# Patient Record
Sex: Female | Born: 1970
Health system: Southern US, Community
[De-identification: ages and names within clinical notes are randomized; demographics above are authoritative.]

## PROBLEM LIST (undated history)

## (undated) DIAGNOSIS — F319 Bipolar disorder, unspecified: Secondary | ICD-10-CM

## (undated) DIAGNOSIS — F988 Other specified behavioral and emotional disorders with onset usually occurring in childhood and adolescence: Secondary | ICD-10-CM

## (undated) DIAGNOSIS — G43909 Migraine, unspecified, not intractable, without status migrainosus: Secondary | ICD-10-CM

## (undated) DIAGNOSIS — F419 Anxiety disorder, unspecified: Secondary | ICD-10-CM

## (undated) DIAGNOSIS — F329 Major depressive disorder, single episode, unspecified: Secondary | ICD-10-CM

## (undated) DIAGNOSIS — IMO0002 Reserved for concepts with insufficient information to code with codable children: Secondary | ICD-10-CM

## (undated) DIAGNOSIS — F32A Depression, unspecified: Secondary | ICD-10-CM

## (undated) HISTORY — DX: Migraine, unspecified, not intractable, without status migrainosus: G43.909

## (undated) HISTORY — DX: Depression, unspecified: F32.A

## (undated) HISTORY — DX: Reserved for concepts with insufficient information to code with codable children: IMO0002

## (undated) HISTORY — DX: Major depressive disorder, single episode, unspecified: F32.9

---

## 1998-02-16 ENCOUNTER — Other Ambulatory Visit: Admission: RE | Admit: 1998-02-16 | Discharge: 1998-02-16 | Payer: Self-pay | Admitting: Obstetrics and Gynecology

## 1998-06-23 ENCOUNTER — Inpatient Hospital Stay (HOSPITAL_COMMUNITY): Admission: AD | Admit: 1998-06-23 | Discharge: 1998-06-23 | Payer: Self-pay | Admitting: Obstetrics and Gynecology

## 1998-12-15 ENCOUNTER — Inpatient Hospital Stay (HOSPITAL_COMMUNITY): Admission: AD | Admit: 1998-12-15 | Discharge: 1998-12-18 | Payer: Self-pay | Admitting: Obstetrics and Gynecology

## 1999-01-25 ENCOUNTER — Other Ambulatory Visit: Admission: RE | Admit: 1999-01-25 | Discharge: 1999-01-25 | Payer: Self-pay | Admitting: Obstetrics & Gynecology

## 2000-05-09 ENCOUNTER — Other Ambulatory Visit: Admission: RE | Admit: 2000-05-09 | Discharge: 2000-05-09 | Payer: Self-pay | Admitting: Obstetrics & Gynecology

## 2002-01-22 ENCOUNTER — Inpatient Hospital Stay (HOSPITAL_COMMUNITY): Admission: EM | Admit: 2002-01-22 | Discharge: 2002-01-25 | Payer: Self-pay | Admitting: Psychiatry

## 2003-02-09 ENCOUNTER — Emergency Department (HOSPITAL_COMMUNITY): Admission: EM | Admit: 2003-02-09 | Discharge: 2003-02-10 | Payer: Self-pay | Admitting: *Deleted

## 2004-03-17 ENCOUNTER — Other Ambulatory Visit: Admission: RE | Admit: 2004-03-17 | Discharge: 2004-03-17 | Payer: Self-pay | Admitting: Family Medicine

## 2004-10-07 ENCOUNTER — Other Ambulatory Visit: Admission: RE | Admit: 2004-10-07 | Discharge: 2004-10-07 | Payer: Self-pay | Admitting: Family Medicine

## 2005-04-04 ENCOUNTER — Other Ambulatory Visit: Admission: RE | Admit: 2005-04-04 | Discharge: 2005-04-04 | Payer: Self-pay | Admitting: Family Medicine

## 2005-08-06 ENCOUNTER — Encounter: Admission: RE | Admit: 2005-08-06 | Discharge: 2005-08-06 | Payer: Self-pay | Admitting: *Deleted

## 2005-08-28 DIAGNOSIS — IMO0002 Reserved for concepts with insufficient information to code with codable children: Secondary | ICD-10-CM

## 2005-08-28 HISTORY — DX: Reserved for concepts with insufficient information to code with codable children: IMO0002

## 2005-09-18 ENCOUNTER — Other Ambulatory Visit: Admission: RE | Admit: 2005-09-18 | Discharge: 2005-09-18 | Payer: Self-pay | Admitting: Obstetrics & Gynecology

## 2005-12-12 ENCOUNTER — Other Ambulatory Visit: Admission: RE | Admit: 2005-12-12 | Discharge: 2005-12-12 | Payer: Self-pay | Admitting: Obstetrics & Gynecology

## 2005-12-15 ENCOUNTER — Ambulatory Visit: Payer: Self-pay | Admitting: Psychiatry

## 2005-12-15 ENCOUNTER — Inpatient Hospital Stay (HOSPITAL_COMMUNITY): Admission: AD | Admit: 2005-12-15 | Discharge: 2005-12-20 | Payer: Self-pay | Admitting: Psychiatry

## 2005-12-21 ENCOUNTER — Other Ambulatory Visit (HOSPITAL_COMMUNITY): Admission: RE | Admit: 2005-12-21 | Discharge: 2006-01-20 | Payer: Self-pay | Admitting: Psychiatry

## 2006-03-07 ENCOUNTER — Encounter (INDEPENDENT_AMBULATORY_CARE_PROVIDER_SITE_OTHER): Payer: Self-pay | Admitting: Specialist

## 2006-03-07 ENCOUNTER — Ambulatory Visit (HOSPITAL_COMMUNITY): Admission: RE | Admit: 2006-03-07 | Discharge: 2006-03-07 | Payer: Self-pay | Admitting: Obstetrics & Gynecology

## 2006-10-31 ENCOUNTER — Ambulatory Visit (HOSPITAL_COMMUNITY): Payer: Self-pay | Admitting: Psychiatry

## 2007-01-02 ENCOUNTER — Ambulatory Visit (HOSPITAL_COMMUNITY): Payer: Self-pay | Admitting: Psychiatry

## 2007-03-06 ENCOUNTER — Ambulatory Visit (HOSPITAL_COMMUNITY): Payer: Self-pay | Admitting: Psychiatry

## 2007-05-15 ENCOUNTER — Ambulatory Visit (HOSPITAL_COMMUNITY): Payer: Self-pay | Admitting: Psychiatry

## 2007-08-02 ENCOUNTER — Encounter: Admission: RE | Admit: 2007-08-02 | Discharge: 2007-08-02 | Payer: Self-pay | Admitting: Obstetrics & Gynecology

## 2007-08-14 ENCOUNTER — Ambulatory Visit (HOSPITAL_COMMUNITY): Payer: Self-pay | Admitting: Psychiatry

## 2007-09-18 ENCOUNTER — Ambulatory Visit (HOSPITAL_COMMUNITY): Payer: Self-pay | Admitting: Psychiatry

## 2007-10-23 ENCOUNTER — Ambulatory Visit (HOSPITAL_COMMUNITY): Payer: Self-pay | Admitting: Psychiatry

## 2008-01-01 ENCOUNTER — Ambulatory Visit (HOSPITAL_COMMUNITY): Payer: Self-pay | Admitting: Psychiatry

## 2008-04-01 ENCOUNTER — Ambulatory Visit (HOSPITAL_COMMUNITY): Payer: Self-pay | Admitting: Psychiatry

## 2008-06-03 ENCOUNTER — Ambulatory Visit (HOSPITAL_COMMUNITY): Payer: Self-pay | Admitting: Psychiatry

## 2008-07-29 ENCOUNTER — Ambulatory Visit (HOSPITAL_COMMUNITY): Payer: Self-pay | Admitting: Psychiatry

## 2008-09-16 ENCOUNTER — Ambulatory Visit (HOSPITAL_COMMUNITY): Payer: Self-pay | Admitting: Psychiatry

## 2008-12-30 ENCOUNTER — Ambulatory Visit (HOSPITAL_COMMUNITY): Payer: Self-pay | Admitting: Psychiatry

## 2009-03-03 ENCOUNTER — Ambulatory Visit (HOSPITAL_COMMUNITY): Payer: Self-pay | Admitting: Psychiatry

## 2009-03-24 ENCOUNTER — Ambulatory Visit (HOSPITAL_COMMUNITY): Payer: Self-pay | Admitting: Psychiatry

## 2009-09-08 ENCOUNTER — Ambulatory Visit (HOSPITAL_COMMUNITY): Payer: Self-pay | Admitting: Psychiatry

## 2009-12-01 ENCOUNTER — Ambulatory Visit (HOSPITAL_COMMUNITY): Payer: Self-pay | Admitting: Psychiatry

## 2010-01-19 ENCOUNTER — Ambulatory Visit (HOSPITAL_COMMUNITY): Payer: Self-pay | Admitting: Psychiatry

## 2010-02-07 ENCOUNTER — Ambulatory Visit: Payer: Self-pay | Admitting: Psychiatry

## 2010-06-01 ENCOUNTER — Encounter: Admission: RE | Admit: 2010-06-01 | Discharge: 2010-06-01 | Payer: Self-pay | Admitting: Obstetrics & Gynecology

## 2010-06-08 ENCOUNTER — Emergency Department (HOSPITAL_COMMUNITY): Admission: EM | Admit: 2010-06-08 | Discharge: 2010-06-08 | Payer: Self-pay | Admitting: Emergency Medicine

## 2010-09-12 ENCOUNTER — Encounter
Admission: RE | Admit: 2010-09-12 | Discharge: 2010-09-12 | Payer: Self-pay | Source: Home / Self Care | Attending: Obstetrics & Gynecology | Admitting: Obstetrics & Gynecology

## 2010-09-28 ENCOUNTER — Other Ambulatory Visit: Payer: Self-pay | Admitting: Dermatology

## 2011-01-13 NOTE — Discharge Summary (Signed)
[facility]  Patient:    Carolyn Jennings, Carolyn Jennings Visit Number: [id number] MRN: [id number]          Service Type: PSY Location: 300 [id number] Attending Physician:  Rachael Fee Dictated by:   Reymundo Poll Dub Mikes, M.D. Admit Date:  01/22/2002 Discharge Date: 01/25/2002                             Discharge Summary  CHIEF COMPLAINT AND PRESENTING ILLNESS:  This was the first admission to [facility] for this 40 year old female, voluntarily admitted. Presented to emergency department with thoughts of shooting herself for the past 2 days, had cut on herself on the arm with hasp and did inflict superficial lacerations on the left forearm.  Endorses suicidal thoughts for the past 2 days, complained about marital conflict over the past 1-4 weeks. Denies any auditory or visual hallucinations or homicidal ideas.  She had stopped her Lexapro 1 month ago, not taking it and has been drinking, one month ago drinking up to 4 bottles of Smirnoff Ice per day.  Said that she quit this about a month ago.  Her husband believes that she is still drinking and complains that this is affecting her mood.  Also endorsed depressed mood, especially in relationship with her kids and her husband, they seem to get along.  Denies any sleep changes.  Endorses irritability and admits to suicidal thoughts.  PAST PSYCHIATRIC HISTORY:  Has been seen on an outpatient basis.  Has been on lithium for the past 3 or 4 months for mood stabilization.  ALCOHOL AND DRUG HISTORY:  Denies the use but there is evidence of increased use, up to several months ago.  MEDICAL HISTORY:  Noncontributory.  MEDICATIONS:  Ortho-Novum oral contraceptive, lithium 300 in the morning and 600 at night, Seroquel 200 to 400 at bedtime, Adderall 30 every morning, Restoril 7.5 at night, Lexapro 30 mg daily.  PHYSICAL EXAMINATION:  Performed, failed to show any acute findings.  MENTAL STATUS EXAMINATION:   Reveals a well-nourished, well-developed, healthy appearing, fully alert female in no acute distress, cooperative, generally pleasant affect, irritable episodically throughout the interview, especially when beginning to discuss her conflict with her husband.  Speech is normal and spontaneous, relevant.  Mood is mildly irritable and somewhat depressed. Thought processes are logical and goal directed.  Some vague suicidal ideas without specific intent.  No evidence of homicidal ideation, no auditory or visual hallucinations, no psychosis.  ADMITTING  DIAGNOSES: Axis I:    1. Rule out depressive disorder not otherwise specified.            2. Attention deficit hyperactivity disorder.            3. Rule out bipolar disorder. Axis II:   No diagnosis. Axis III:  Superficial lacerations. Axis IV:   Moderate. Axis V:    Global assessment of function upon admission 36, highest            global assessment of function in past year 70.  COURSE IN THE HOSPITAL:  She was admitted and started on intensive individual and group psychotherapy.  Carolyn Jennings was admitted, tried to discern between qualities of bipolar versus anxiety versus ADHD, as well as response to her husband.  Does admit to irritability, anger spells, suicidal ruminations, fear of losing control.  Worried that she is going to be like her mother and gives a long history of mood disorders in the  family.  There was a family session where she was not able to focus too well but was able to calm herself and redirect herself.  We went ahead and decreased the Seroquel as she felt she was too tired on this medication.  We started Depakote that she seemed to tolerate well.  May 31, after she had the family session she felt it went better than expected.  She was able to sleep better with the decreased Seroquel.  She denied any suicidal or homicidal ideas.  She was more insightful and hopeful that things were going to go better.  She was going  to be followed by Dr. Elna Breslow.  As she was not suicidal or homicidal, discharge was considered and granted.  Meanwhile, we gave Concerta a try so she could compare it to Adderall for her ADHD.  Still some diagnostic issues, but stable enough to be managed on an outpatient basis.  DISCHARGE  DIAGNOSES: Axis I:    1. Attention deficit hyperactivity disorder.            2. Anxiety disorder not otherwise specified.            3. Depressive disorder not otherwise specified. Axis II:   No diagnosis. Axis III:  Superficial lacerations. Axis IV:   Moderate. Axis V:    Global assessment of function upon discharge 55.  DISCHARGE MEDICATIONS: 1. Lithium 300 1 in the morning and 2 at night. 2. Depakote ER 500 at bedtime. 3. Effexor XR 37.5 to increased to 75. 4. Concerta 18 in the morning. 5. Restoril at bedtime for sleep.  DISPOSITION:  Appointment with Elna Breslow on June 5. Dictated by:   Reymundo Poll Dub Mikes, M.D. Attending Physician:  Rachael Fee DD:  03/05/02 TD:  03/08/02 Job: [id number]

## 2011-01-13 NOTE — H&P (Signed)
Carolyn Jennings, Carolyn Jennings          ACCOUNT NO.:  [id number]   MEDICAL RECORD NO.:  [id number]          PATIENT TYPE:  IPS   LOCATION:  0508                          FACILITY[facility]  [facility]   PHYSICIAN:  Vic Ripper, P.A.-C.DATE OF BIRTH:  05/30/1971   DATE OF ADMISSION:  12/15/2005  DATE OF DISCHARGE:                         PSYCHIATRIC ADMISSION ASSESSMENT   IDENTIFYING INFORMATION:  This  is a 40 year old married white female.  Dr.  Dub Mikes is her private psychiatrist.  She has seen him for approximately 11  years and he suggested admission.  Apparently, she is currently in a manic  episode.  She was given Zyprexa yesterday.  She did sleep better Thursday  night, but recently she has had decreased sleep, decreased appetite.  She  was very confrontational yesterday.  When asked about stressors, the patient  identified marital issues.  She has also been spontaneously incinerating  personal effects, mostly things the patient identifies as clutter.  She  feels  her husband is cheating on her.  This is her third marriage and her  husband's fourth.  She states that he told her to not have the trash people  come anymore, so of course you have to burn up the appropriate trash.  She  says the clutter is things like the roll is left from toilet paper and  things like this.   PAST PSYCHIATRIC HISTORY:  She has had one prior admission in 2003.  She  states that as a small child she was a sleepwalker and was diagnosed as  ADHD.  She ran away at age 88 at least twice.  She has a long history for  mood disorder.  She has seen Dr. Dub Mikes for at least 10 years on an outpatient  basis.   SOCIAL HISTORY:  She states she has had some college.  As already stated,  this is her third marriage and her husband's fourth.  She is not employed.  She has two daughters, ages 20 and 97.   FAMILY HISTORY:  She states that her whole family is nuts.  Her mom drinks,  starts drinking in the afternoon.  Her father ran  through his inheritance  and is probably bipolar.   ALCOHOL AND DRUG HISTORY:  She denies alcohol.  She is smoking one pack of  cigarettes per day for the past four months and she denies any drugs and her  UDS bears this out.   PRIMARY CARE PHYSICIAN:  Dr. Cliffton Asters.   MEDICAL PROBLEMS:  She states that over the last few months she was  evaluated for Guillain-Barre syndrome due to excessive fatigue and weakness.  She is currently being evaluated for cervical cancer.   MEDICATIONS:  She had discontinued her Lamictal and I am not sure what other  drug she was on.  Apparently, she frequently stops her medications.   ALLERGIES:  NO KNOWN DRUG ALLERGIES.   PHYSICAL EXAMINATION:  GENERAL APPEARANCE:  This is a well-developed, well-  nourished female in no acute distress.  VITAL SIGNS:  Her height is 61 inches tall.  She weighs 103.  Her  temperature is 98.3, blood pressure 129/88, pulse 95,  respirations are 18.   Today she is alert and oriented.  She is casually groomed and dressed,  adequately nourished.  Her speech is not pressured, although she gets  somewhat tangential.  Her mood is anxious and her affect is congruent .  Her  thought processes are coherent and relevant.  Judgment and insight are fair.  Concentration and memory are intact.  Intelligence is at least average.  She  denies suicidal or homicidal ideations.  She denies auditory or visual  hallucinations, however, she states that she obsesses about her husband  cheating on her and cleaning and she denies any inappropriateness about  burning the trash.   AXIS I.  Bipolar disorder, mixed, severe, with psychosis.  AXIS II.  Deferred.  AXIS III.  Being ruled out for cervical cancer.  AXIS IV.  Marital issues.  AXIS V.  35.   PLAN:  Admit for safety and stabilization, to adjust her medications as  indicated.  Dr. Dub Mikes was able to see her yesterday before he left for the  weekend.  He states that she is clean from Adderall.   She has used Lithium,  Depakote, Seroquel in the past, she claims no benefit or side effects from  those drugs.  He left orders for her to have Zyprexa 2.5 mg p.o. b.i.d.  Zyprexa Zydis 10 mg at h.s. and Xanax 0.5 mg at h.s. She did have a family  session today that went well.   ESTIMATED LENGTH OF STAY:  Four to five days.      Vic Ripper, P.A.-C.     MD/MEDQ  D:  12/16/2005  T:  12/17/2005  Job:  [id number]

## 2011-01-13 NOTE — Discharge Summary (Signed)
NAMELEVORA, WERDEN NO.:  [id number]   MEDICAL RECORD NO.:  [id number]          PATIENT TYPE:  IPS   LOCATION:  0508                          [facility]ILITY[facility]  [facility]   PHYSICIAN:  Geoffery Lyons, M.D.      DATE OF BIRTH:  [birth year]   DATE OF ADMISSION:  12/15/2005  DATE OF DISCHARGE:  12/20/2005                                 DISCHARGE SUMMARY   CHIEF COMPLAINT AND PRESENT ILLNESS:  This was the second admission to [facility] for this 40 year old married white female.  Admitted  due to increased agitation, decreased sleep, some underlying paranoia.  Main  conflict is what she related to as marital issues.  She has been  spontaneously __________ personality effects, mostly things she identifies  as __________.  Feels that her husband is cheating on her.  Third marriage  and her husband's fourth.   PAST PSYCHIATRIC HISTORY:  Prior admission in 2003.  Diagnosed ADHD.  Long  history of a mood disorder.   ALCOHOL/DRUG HISTORY:  Denies active use of any substances.   MEDICAL HISTORY:  Noncontributory.   MEDICATIONS:  More recently placed on Zyprexa.   PHYSICAL EXAMINATION:  Performed and failed to show any acute findings.   LABORATORY DATA:  Results not available in the chart.   MENTAL STATUS EXAM:  Alert, cooperative female.  Pressured speech, somewhat  circumstantial and tangential, anxious but thought processes are coherent  and relevant.  There is some underlying paranoia.  Cognition was well-  preserved.   ADMISSION DIAGNOSES:  AXIS I:  Bipolar disorder with psychotic features.  Attention-deficit hyperactivity disorder.  AXIS II:  No diagnosis.  AXIS III:  No diagnosis.  AXIS IV:  Moderate.  AXIS V:  GAF upon admission 35; highest GAF in the last year 70-75.   HOSPITAL COURSE:  She was admitted.  She was started in individual and group  psychotherapy.  She was given the Zyprexa 2.5 mg twice a day and 5 mg at  bedtime, Xanax 0.5 mg twice a  day as needed, Ambien 10 mg at bedtime for  sleep.  Continued to work with the Zyprexa.  Upon admission, she was  evidencing some paranoia, history of bipolar disorder, ADHD, for the last  two months paranoia, claims that the husband was involved in activities,  claimed that her phone was wired, he is involved with other women, he has  special power that he is using, not sure why this is happening but claimed  that it was happening.  Kept giving papers to a friend that she was  collecting as evidence of what was going on.  Multiple papers.  The friend  endorsed that this is mostly junk mail, has no relevance to what she is  trying to prove.  Trying to connect different events, alleges a conspiracy,  constantly talking about this, has not slept in three days.  Has been on  Adderall for a long time, claimed no abuse.  Has used lithium, Depakote and  Seroquel in the past with no benefit or side effects.  There was a family  session with the  husband.  They were able to talk about their issues.  As  the hospitalization progressed and the medication was adjusted and mood  started improving.  On December 16, 2005, she was pretty suspicious,  experienced what seemed to be some akathisia, she was given Cogentin  successfully.  She was encouraged to challenge her perception.  Endorsed the  anxiety, the agitation, irritability.  She was able to interact with the  husband without feeling out of control.  Has been able to assert herself.  More insightful, able to see herself when she was going through one of the  paranoid thoughts.  Able to challenge her own perceptions and challenge her  reality.  Able to stay focused, was able to tolerate the Zyprexa well.  Willing to pursue the medication further.  On April 25th, it was felt that  she had obtained full benefit from the hospitalization.  There were no  active suicidal or homicidal ideation.  No hallucinations.  No delusions.  Much better, more  insightful, developed coping skills, able to challenge her  perceptions.  She was willing to come to IOP, intensive outpatient program,  for further stabilization.   DISCHARGE DIAGNOSES:  AXIS I:  Bipolar disorder with psychotic features.  Attention-deficit hyperactivity disorder.  AXIS II:  No diagnosis.  AXIS III:  No diagnosis.  AXIS IV:  Moderate.  AXIS V:  GAF upon discharge 60.   DISCHARGE MEDICATIONS:  1.  Zyprexa 2.5 mg twice a day.  2.  Xanax 0.5 mg three times a day and at bedtime.  3.  Ambien 10 mg at bedtime as needed for sleep.  4.  Zyprexa Zydis 10 mg at night.  5.  Cogentin 1 mg twice a day.   [facility].      Geoffery Lyons, M.D.  Electronically Signed     IL/MEDQ  D:  01/18/2006  T:  01/19/2006  Job:  [id number]

## 2011-01-13 NOTE — H&P (Signed)
[facility]  Patient:    Carolyn Jennings, Carolyn Jennings Visit Number: [id number] MRN: [id number]          Service Type: EMS Location: ED Attending Physician:  Corlis Leak. Dictated by:   Young Berry Scott, N.P. Admit Date:  01/22/2002 Discharge Date: 01/22/2002                     Psychiatric Admission Assessment  DATE OF ASSESSMENT:  Jan 23, 2002 at 10:20 a.m.  IDENTIFYING INFORMATION:  This is a 40 year old Caucasian female who is married, voluntary admission.  HISTORY OF PRESENT ILLNESS:  This patient presented in the emergency department with thoughts of shooting herself for the past two days and had stabbed herself in the arm with a half-centimeter superficial laceration in the left forearm.  She was referred by her psychiatrist and psychotherapist for admission.  She endorses suicidal thoughts for the past two days accompanied by marital conflict over the past 1-4 weeks.  She denies any auditory or visual hallucinations or homicidal ideation.  She had stopped her Lexapro approximately one month ago, is not taking it and had been drinking, approximately one month ago, drinking up to four bottles of Smirnoff Ice per day but states that she quit this about a month ago.  However, her husband believes that she is still drinking and complains that this is affecting her mood.  The patient also endorses depressed mood, especially in relation to her activities with her husband and their ability to get along.  She denies any sleep changes.  She endorses some irritability and admits to suicidal thoughts.  Denies any appetite changes but complains that she has gained 40 pounds in the past three months since starting on lithium, which has also given her a very fine motor tremor in both hands.  PAST PSYCHIATRIC HISTORY:  The patient is followed by Dr. Elna Breslow and Dr. Geoffery Lyons at [facility].  The patient has been on lithium for the  past 3-4 months for mood stabilization as previously noted. This is [facility] to [facility].  This is her first inpatient psychiatric admission.  SOCIAL HISTORY:  This is a 40 year old Latino female, who has been married for the past four years.  This is her second marriage.  She has two daughters, first daughter, age 81, from a prior marriage, and a 23-year-old daughter from her current marriage.  She works as a Futures trader.  She is not employed outside of the home.  She moved from [place] to [place] at age 32.  She is currently living in [place] with her husband and her two daughters.  She has a high school education.  FAMILY HISTORY:  Parents with a history of alcohol abuse.  ALCOHOL/DRUG HISTORY:  The patient reports she occasionally continues to ETOH and alcohol use noted above.  She denies any other substance abuse.  She smokes approximately 1/2 pack per day.  MEDICAL HISTORY:  The patient is followed by Dr. Laurann Montana at [facility].  Medical problems are tremor secondary to the lithium.  She denies any other medical problems.  Denies any risk of sexually transmitted diseases. No prior surgeries.  No prior hospitalizations.  MEDICATIONS:  Ortho-Novum oral contraceptive 1 daily, lithium 300 mg q.a.m. and 600 mg in the p.m., Seroquel 200 mg to 400 mg q.h.s. (normally takes only 200 mg q.h.s.), Adderall 30 mg every morning, Restoril 7.5 mg q.h.s., Lexapro 30 mg daily, which patient  states she has not taken in approximately one month.  She felt that it was not helping her.  DRUG ALLERGIES:  None.  POSITIVE PHYSICAL FINDINGS:  The patients physical examination was done in the emergency room by Dr. Margretta Ditty and was essentially unremarkable.  Vital signs, on admission to the unit, were temperature 98, pulse 86, respirations 20, blood pressure 133/93.  She is 5 feet 2 inches tall and weighs 147.5 pounds.  LABORATORY DATA:  The  patients urine drug screen was positive for amphetamines and benzodiazepines.  Her urine pregnancy test was negative.  Her lithium level is currently pending.  Other lab tests reveal CBC within normal limits with a hemoglobin of 13.4, hematocrit of 38.7, platelets 291.  Her chemistry panel reveals electrolytes within normal limits, BUN 12, creatinine 0.8.  SGOT, SGPT 16, lipase within normal limits at 41.  Her urinalysis was negative.  MENTAL STATUS EXAMINATION:  This is a well-groomed, healthy-appearing, fully alert female, who is in no acute distress.  She is cooperative with a generally pleasant affect but irritable episodically throughout the interview, especially when she begins to discuss her conflicts with her husband.  Speech is normal and spontaneous, relevant.  Mood is mildly irritable and somewhat depressed.  Thought process is logical and goal directed, some vague suicidal ideation without specific intent.  No evidence of homicidal ideation.  No auditory or visual hallucinations.  No psychosis.  Thought process is dominated by her irritation with her husband and what she feels to be his unreasonableness and difficulty in dealing with their marriage issues and financial issues.  Cognitively, she is intact and oriented x 3.  Insight is fair to poor.  Impulse control and judgment within normal limits. Intelligence average to above average.  DIAGNOSES: Axis I:    1. Obsessive-compulsive disorder by history.            2. Attention-deficit hyperactivity disorder. Axis II:   Deferred. Axis III:  Superficial laceration 0.5 centimeter on her left forearm,            currently healing. Axis IV:   Moderate (problems with marital conflict). Axis V:    Current 36; past year 34.  PLAN:  Voluntarily admit the patient to evaluate her suicidal ideation with a goal of eliminating her irritability and eliminating her suicidal ideation.  A lithium level is currently pending.  Meanwhile, we  have elected to start her on Depakote 500 mg q.h.s.  We are going to discontinue her Lexapro and start  her on Effexor XR 37.5 mg q.d. and will keep her at her current dose of Seroquel 200 mg q.h.s.  We will also continue her Adderall at the current routine dose and ask for a family session with her husband prior to discharge.  ESTIMATED LENGTH OF STAY:  Three to four days. Dictated by:   Young Berry Scott, N.P. Attending Physician:  Corlis Leak DD:  01/23/02 TD:  01/26/02 Job: [id number]

## 2011-01-13 NOTE — Op Note (Signed)
Carolyn Jennings, TROCHEZ          ACCOUNT NO.:  [id number]   MEDICAL RECORD NO.:  [id number]          PATIENT TYPE:  AMB   LOCATION:  SDC                           F[facility]ILITY:  [facility]   PHYSICIAN:  Freddy Finner, M.D.   DATE OF BIRTH:  May 15, 1971   DATE OF PROCEDURE:  03/07/2006  DATE OF DISCHARGE:                                 OPERATIVE REPORT   PREOPERATIVE DIAGNOSES:  1.  Cervical intraepithelial neoplasia 1 of cervix.  2.  Very poor tolerance of outpatient surgical procedures.  3.  Pigmented lesion of right buttock.  4.  Skin tag of labia minora on the left.   POSTOPERATIVE DIAGNOSES:  1.  Cervical intraepithelial neoplasia 1 of cervix.  2.  Very poor tolerance of outpatient surgical procedures.  3.  Pigmented lesion of right buttock.  4.  Skin tag of labia minora on the left.  5.  Cystic fluid in what was thought to be a skin tag on the left labia      minora.   OPERATIVE PROCEDURE:  1.  Excision of labial cyst  2.  Excision of pigmented lesion of right buttock.  3.  Cold knife conization of cervix.  4.  Fractional dilatation and curettage.   SURGEON:  Freddy Finner, M.D.   ANESTHESIA:  GENERAL:   ESTIMATED INTRAOPERATIVE BLOOD LOSS:  Less than 50 mL.   COMPLICATIONS:  None.   The patient is a 40 year old with known CIN-1 of cervix with high-risk HPV  identified on Pap testing.  The patient has high anxiety level, and it was  felt that she was unable to tolerate a larger biopsy of the cervix in the  office and wanted excision of her vulvar lesions. For that reason, she was  scheduled as an outpatient.   She was admitted on the morning of surgery.  She was brought to the  operating room, placed under general anesthesia, placed in dorsolithotomy  position.  Betadine prep of mons, perineum, vagina, and buttocks was carried  out in usual fashion.  Sterile drapes were applied.  The pigmented lesion on  the right buttocks with excised with a margin.  A mattress suture  of 4-0  Monocryl was used to close the skin defect  created by the biopsy.  The  cystic lesion on the left labia minora near the clitoral hood was sharply  excised.  A single interrupted 4-0 Monocryl suture was placed here.   Attention was turned to the cervix which was grasped with a single-tooth  tenaculum, stained with Lugol solution.  All Lugol negative areas were  included in the biopsy.  The cone biopsy was circumscribed with a #11  scalpel blade.  The dissection was continued for approximately 1 cm into the  endocervical canal.  Using Mayo scissors, the cone was completely excised.  It was labeled at 12 o'clock with a suture.  Endocervical curettings were  taken separately.  Endometrial curettings were easily obtained with a Heaney  curette without further dilatation of the cervix.  These, too, were  submitted separately.  Cervical angles were controlled with figure-of-eight  zero Dexon.  The  base of the defect created by the cone was fulgurated with  a ball-tipped Bovie.  Surgical was placed into the defect, and suture tags  from the angled sutures tied to hold the Surgicel in place. This produced  adequate hemostasis.  The procedure was terminated.  The patient was allowed  to awake and taken to the recovery room in good condition.  She has  appropriate prescription narcotic pain medication for postoperative relief  at home.  She was given routine outpatient surgical instructions.  She is to  follow up in the office in 2 weeks.      Freddy Finner, M.D.  Electronically Signed     WRN/MEDQ  D:  03/07/2006  T:  03/07/2006  Job:  [id number]

## 2011-05-10 ENCOUNTER — Other Ambulatory Visit: Payer: Self-pay | Admitting: Obstetrics & Gynecology

## 2011-05-10 DIAGNOSIS — Z1231 Encounter for screening mammogram for malignant neoplasm of breast: Secondary | ICD-10-CM

## 2011-06-15 ENCOUNTER — Ambulatory Visit: Payer: Self-pay

## 2011-06-22 ENCOUNTER — Ambulatory Visit
Admission: RE | Admit: 2011-06-22 | Discharge: 2011-06-22 | Disposition: A | Discharge status: Home / Self Care | Payer: BC Managed Care – PPO | Source: Ambulatory Visit | Attending: Obstetrics & Gynecology | Admitting: Obstetrics & Gynecology

## 2011-06-22 DIAGNOSIS — Z1231 Encounter for screening mammogram for malignant neoplasm of breast: Secondary | ICD-10-CM

## 2011-12-07 ENCOUNTER — Inpatient Hospital Stay (HOSPITAL_COMMUNITY)
Admission: EM | Admit: 2011-12-07 | Discharge: 2011-12-07 | DRG: 582 | Disposition: A | Discharge status: Home / Self Care | Payer: BC Managed Care – PPO | Attending: Internal Medicine | Admitting: Internal Medicine

## 2011-12-07 ENCOUNTER — Encounter (HOSPITAL_COMMUNITY): Payer: Self-pay | Admitting: Emergency Medicine

## 2011-12-07 DIAGNOSIS — R110 Nausea: Secondary | ICD-10-CM | POA: Diagnosis present

## 2011-12-07 DIAGNOSIS — T5891XA Toxic effect of carbon monoxide from unspecified source, accidental (unintentional), initial encounter: Secondary | ICD-10-CM | POA: Diagnosis present

## 2011-12-07 DIAGNOSIS — F319 Bipolar disorder, unspecified: Secondary | ICD-10-CM | POA: Diagnosis present

## 2011-12-07 DIAGNOSIS — F411 Generalized anxiety disorder: Secondary | ICD-10-CM | POA: Diagnosis present

## 2011-12-07 DIAGNOSIS — R51 Headache: Secondary | ICD-10-CM | POA: Diagnosis present

## 2011-12-07 DIAGNOSIS — M6282 Rhabdomyolysis: Secondary | ICD-10-CM | POA: Diagnosis present

## 2011-12-07 DIAGNOSIS — T50901A Poisoning by unspecified drugs, medicaments and biological substances, accidental (unintentional), initial encounter: Secondary | ICD-10-CM

## 2011-12-07 DIAGNOSIS — T59891A Toxic effect of other specified gases, fumes and vapors, accidental (unintentional), initial encounter: Principal | ICD-10-CM | POA: Diagnosis present

## 2011-12-07 DIAGNOSIS — N390 Urinary tract infection, site not specified: Secondary | ICD-10-CM | POA: Diagnosis present

## 2011-12-07 DIAGNOSIS — T1491XA Suicide attempt, initial encounter: Secondary | ICD-10-CM

## 2011-12-07 DIAGNOSIS — R4182 Altered mental status, unspecified: Secondary | ICD-10-CM | POA: Diagnosis present

## 2011-12-07 DIAGNOSIS — F17200 Nicotine dependence, unspecified, uncomplicated: Secondary | ICD-10-CM | POA: Diagnosis present

## 2011-12-07 HISTORY — DX: Bipolar disorder, unspecified: F31.9

## 2011-12-07 HISTORY — DX: Anxiety disorder, unspecified: F41.9

## 2011-12-07 LAB — COMPREHENSIVE METABOLIC PANEL
ALT: 41 U/L — ABNORMAL HIGH (ref 0–35)
AST: 101 U/L — ABNORMAL HIGH (ref 0–37)
Albumin: 4.1 g/dL (ref 3.5–5.2)
Alkaline Phosphatase: 50 U/L (ref 39–117)
Calcium: 9 mg/dL (ref 8.4–10.5)
Potassium: 3.9 mEq/L (ref 3.5–5.1)
Sodium: 138 mEq/L (ref 135–145)
Total Protein: 7 g/dL (ref 6.0–8.3)

## 2011-12-07 LAB — CARBOXYHEMOGLOBIN
Carboxyhemoglobin: 2.9 % — ABNORMAL HIGH (ref 0.5–1.5)
Methemoglobin: 1.4 % (ref 0.0–1.5)
O2 Saturation: 99.7 %
Total hemoglobin: 13.2 g/dL (ref 12.5–16.0)

## 2011-12-07 LAB — CBC
Hemoglobin: 14.2 g/dL (ref 12.0–15.0)
MCHC: 35.1 g/dL (ref 30.0–36.0)
Platelets: 261 10*3/uL (ref 150–400)
RDW: 13.2 % (ref 11.5–15.5)

## 2011-12-07 LAB — RAPID URINE DRUG SCREEN, HOSP PERFORMED
Amphetamines: POSITIVE — AB
Benzodiazepines: POSITIVE — AB
Tetrahydrocannabinol: NOT DETECTED

## 2011-12-07 LAB — URINE MICROSCOPIC-ADD ON

## 2011-12-07 LAB — URINALYSIS, ROUTINE W REFLEX MICROSCOPIC
Ketones, ur: NEGATIVE mg/dL
Protein, ur: NEGATIVE mg/dL
Urobilinogen, UA: 0.2 mg/dL (ref 0.0–1.0)

## 2011-12-07 LAB — CARDIAC PANEL(CRET KIN+CKTOT+MB+TROPI)
Relative Index: 0.1 (ref 0.0–2.5)
Total CK: 5779 U/L — ABNORMAL HIGH (ref 7–177)
Troponin I: <0.3 ng/mL (ref <0.30)

## 2011-12-07 LAB — ETHANOL: Alcohol, Ethyl (B): 62 mg/dL — ABNORMAL HIGH (ref 0–11)

## 2011-12-07 LAB — POCT I-STAT 3, ART BLOOD GAS (G3+)
Patient temperature: 98.5
pCO2 arterial: 43.2 mmHg (ref 35.0–45.0)
pH, Arterial: 7.355 (ref 7.350–7.400)

## 2011-12-07 LAB — PREGNANCY, URINE: Preg Test, Ur: NEGATIVE

## 2011-12-07 MED ORDER — SODIUM CHLORIDE 0.9 % IJ SOLN: 3.0000 mL | Freq: Two times a day (BID) | INTRAMUSCULAR | Status: DC

## 2011-12-07 MED ORDER — GUAIFENESIN-DM 100-10 MG/5ML PO SYRP: 5.0000 mL | ORAL_SOLUTION | ORAL | Status: DC | PRN

## 2011-12-07 MED ORDER — ONDANSETRON HCL 4 MG/2ML IJ SOLN
4.0000 mg | Freq: Once | INTRAMUSCULAR | Status: AC
  Administered 2011-12-07: 4 mg via INTRAVENOUS
  Filled 2011-12-07: qty 2

## 2011-12-07 MED ORDER — CIPROFLOXACIN IN D5W 400 MG/200ML IV SOLN
400.0000 mg | Freq: Once | INTRAVENOUS | Status: AC
  Administered 2011-12-07: 400 mg via INTRAVENOUS
  Filled 2011-12-07: qty 200

## 2011-12-07 MED ORDER — SODIUM CHLORIDE 0.9 % IV SOLN
INTRAVENOUS | Status: DC
  Administered 2011-12-07: 1000 mL via INTRAVENOUS

## 2011-12-07 MED ORDER — PANTOPRAZOLE SODIUM 40 MG PO TBEC: 40.0000 mg | DELAYED_RELEASE_TABLET | Freq: Every day | ORAL | Status: DC

## 2011-12-07 MED ORDER — ALBUTEROL SULFATE (5 MG/ML) 0.5% IN NEBU: 2.5000 mg | INHALATION_SOLUTION | RESPIRATORY_TRACT | Status: DC | PRN

## 2011-12-07 MED ORDER — CIPROFLOXACIN HCL 250 MG PO TABS: 500.0000 mg | ORAL_TABLET | Freq: Two times a day (BID) | ORAL | Status: AC

## 2011-12-07 MED ORDER — NALOXONE HCL 0.4 MG/ML IJ SOLN
INTRAMUSCULAR | Status: AC
  Administered 2011-12-07 (×2)
  Filled 2011-12-07: qty 1

## 2011-12-07 MED ORDER — ONDANSETRON HCL 4 MG/2ML IJ SOLN: 4.0000 mg | Freq: Four times a day (QID) | INTRAMUSCULAR | Status: DC | PRN

## 2011-12-07 MED ORDER — ONDANSETRON HCL 4 MG PO TABS: 4.0000 mg | ORAL_TABLET | Freq: Four times a day (QID) | ORAL | Status: DC | PRN

## 2011-12-07 MED ORDER — SODIUM CHLORIDE 0.9 % IV BOLUS (SEPSIS)
1000.0000 mL | INTRAVENOUS | Status: AC
  Administered 2011-12-07: 1000 mL via INTRAVENOUS

## 2011-12-07 MED ORDER — CIPROFLOXACIN HCL 250 MG PO TABS
250.0000 mg | ORAL_TABLET | Freq: Two times a day (BID) | ORAL | Status: DC
  Administered 2011-12-07: 250 mg via ORAL
  Filled 2011-12-07 (×4): qty 1

## 2011-12-07 NOTE — Progress Notes (Signed)
Utilization Review Completed.Carolyn Jennings T4/06/2012   
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
## 2011-12-07 NOTE — ED Notes (Signed)
Sitter was at bedside once I departed.

## 2011-12-07 NOTE — Progress Notes (Signed)
Pt received discharge instructions and prescription for Cipro. Knows to follow up with Dr. Clelia Croft and Dr. Dub Mikes. Encouraged to use community resource numbers if needed. Received belongings, including shirt, underwear, towel, and necklace. Volunteer wheeled out. Husband to transport home. Duwaine Maxin, RN
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
## 2011-12-07 NOTE — ED Notes (Signed)
Dr. Adela Glimpse placed pt on suicide precautions. Informed A/C that pt will need sitter and she stated that she will get pt a sitter in the morning and the floor will need to cover the until 7AM. Will make floor aware. Currently sitter at bedside in ED. Will continue to monitor

## 2011-12-07 NOTE — ED Notes (Signed)
Pt placed in paper scrubs.
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
## 2011-12-07 NOTE — ED Notes (Signed)
Pt placed on nonrebreather per Dr. Garen Lah orders

## 2011-12-07 NOTE — ED Notes (Signed)
Pt wanded by security. 

## 2011-12-07 NOTE — ED Notes (Signed)
Spoke with EDP about patient about whether or not we are considering this a suicide attempt. Per Dr. Hyacinth Meeker we are unable to determine that since the patient is denying any attempt and there is not family available to question. Dr. Hyacinth Meeker stated at this time pt does not need to be on suicide precautions and does not need sitter. Will continue to monitor.
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
## 2011-12-07 NOTE — ED Notes (Signed)
RN not ready for report. Will continue to monitor.

## 2011-12-07 NOTE — Discharge Summary (Signed)
DISCHARGE SUMMARY  Carolyn Jennings  MR#: [id number]  DOB:1970-12-15  Date of Admission: 12/07/2011 Date of Discharge: 12/07/2011  Attending Physician:Carolyn Jennings  Patient's PCP: Carolyn Jennings  Consults: none.  Discharge Diagnoses: Present on Admission:  .Rhabdomyolysis .Accidental poisoning by unspecified carbon monoxide .UTI (lower urinary tract infection)    Hospital Course: Carolyn Jennings came in with carbon monoxide from a leaking propane tank at home. The circumstances of the leakage are not clear, but Carolyn Jennings and Carolyn Jennings both feel this was an accidental leakage. She says that she had gone into the bathroom to take a shower when she passed out. Fortunately, she had called Carolyn Jennings before getting into the shower. The Jennings came home 45 minutes later, from work, and found Carolyn unconscious. Carolyn Jennings came to after getting some fresh air. Jennings says he almost passed out in the house too. Jennings says his wife did not mention suicide at any point, and Carolyn Jennings strongly denies this as well. She says she has a lot to live for, including a new job for which she was scheduled to interview today, as well as a son. The couple have had marital problems which they are sorting out. Carolyn Jennings was found to have possible uti, and rhabdomyolysis but she is tolerating feeds and is back to Carolyn baseline. She is eager to go home, and she will follow with Carolyn Jennings for Carolyn Bipolar, and Carolyn Jennings next week(she will call for an appointment- to follow up Carolyn cpk, and renal function). I encouraged Carolyn to drink more fluids. She strenuously denies suicidal ideation.   Medication List  As of 12/07/2011 11:13 AM   STOP taking these medications         naloxone 0.4 MG/ML injection         TAKE these medications         ALPRAZolam 1 MG tablet   Commonly known as: XANAX   Take 1 mg by mouth 3 (three) times daily as needed.      amphetamine-dextroamphetamine 30 MG tablet   Commonly known as: ADDERALL   Take 30 mg by mouth 2 (two) times daily.      ARIPiprazole 10 MG tablet   Commonly known as: ABILIFY   Take 10 mg by mouth daily.      ciprofloxacin 250 MG tablet   Commonly known as: CIPRO   Take 2 tablets (500 mg total) by mouth 2 (two) times daily.      fluvoxaMINE 25 MG tablet   Commonly known as: LUVOX   Take 25 mg by mouth at bedtime.             Day of Discharge BP 114/74  Pulse 81  Temp(Src) 98 F (36.7 C) (Oral)  Resp 19  Ht 5' 4.5" (1.638 m)  Wt 70.308 kg (155 lb)  BMI 26.19 kg/m2  SpO2 100%  Physical Exam: Normal.  Results for orders placed during the hospital encounter of 12/07/11 (from the past 24 hour(s))  CARBOXYHEMOGLOBIN     Status: Abnormal   Collection Time   12/07/11  1:17 AM      Component Value Range   Total hemoglobin 13.2  12.5 - 16.0 (g/dL)   O2 Saturation 40.9     Carboxyhemoglobin 2.9 (*) 0.5 - 1.5 (%)   Methemoglobin 1.4  0.0 - 1.5 (%)  CBC     Status: Normal   Collection Time   12/07/11  1:20 AM      Component Value Range  WBC 8.6  4.0 - 10.5 (K/uL)   RBC 4.38  3.87 - 5.11 (MIL/uL)   Hemoglobin 14.2  12.0 - 15.0 (g/dL)   HCT 04.5  40.9 - 81.1 (%)   MCV 92.5  78.0 - 100.0 (fL)   MCH 32.4  26.0 - 34.0 (pg)   MCHC 35.1  30.0 - 36.0 (g/dL)   RDW 91.4  78.2 - 95.6 (%)   Platelets 261  150 - 400 (K/uL)  COMPREHENSIVE METABOLIC PANEL     Status: Abnormal   Collection Time   12/07/11  1:20 AM      Component Value Range   Sodium 138  135 - 145 (mEq/L)   Potassium 3.9  3.5 - 5.1 (mEq/L)   Chloride 104  96 - 112 (mEq/L)   CO2 24  19 - 32 (mEq/L)   Glucose, Bld 83  70 - 99 (mg/dL)   BUN 16  6 - 23 (mg/dL)   Creatinine, Ser 2.13  0.50 - 1.10 (mg/dL)   Calcium 9.0  8.4 - 08.6 (mg/dL)   Total Protein 7.0  6.0 - 8.3 (g/dL)   Albumin 4.1  3.5 - 5.2 (g/dL)   AST 578 (*) 0 - 37 (U/L)   ALT 41 (*) 0 - 35 (U/L)   Alkaline Phosphatase 50  39 - 117 (U/L)   Total Bilirubin 0.2 (*) 0.3 - 1.2 (mg/dL)   GFR calc  non Af Amer 84 (*) >90 (mL/min)   GFR calc Af Amer >90  >90 (mL/min)  ACETAMINOPHEN LEVEL     Status: Normal   Collection Time   12/07/11  1:20 AM      Component Value Range   Acetaminophen (Tylenol), Serum <15.0  10 - 30 (ug/mL)  SALICYLATE LEVEL     Status: Abnormal   Collection Time   12/07/11  1:20 AM      Component Value Range   Salicylate Lvl <2.0 (*) 2.8 - 20.0 (mg/dL)  ETHANOL     Status: Abnormal   Collection Time   12/07/11  1:20 AM      Component Value Range   Alcohol, Ethyl (B) 62 (*) 0 - 11 (mg/dL)  CARDIAC PANEL(CRET KIN+CKTOT+MB+TROPI)     Status: Abnormal   Collection Time   12/07/11  1:20 AM      Component Value Range   Total CK 5779 (*) 7 - 177 (U/L)   CK, MB 8.0 (*) 0.3 - 4.0 (ng/mL)   Troponin I <0.30  <0.30 (ng/mL)   Relative Index 0.1  0.0 - 2.5   URINE RAPID DRUG SCREEN (HOSP PERFORMED)     Status: Abnormal   Collection Time   12/07/11  1:53 AM      Component Value Range   Opiates NONE DETECTED  NONE DETECTED    Cocaine NONE DETECTED  NONE DETECTED    Benzodiazepines POSITIVE (*) NONE DETECTED    Amphetamines POSITIVE (*) NONE DETECTED    Tetrahydrocannabinol NONE DETECTED  NONE DETECTED    Barbiturates NONE DETECTED  NONE DETECTED   URINALYSIS, ROUTINE W REFLEX MICROSCOPIC     Status: Abnormal   Collection Time   12/07/11  1:53 AM      Component Value Range   Color, Urine YELLOW  YELLOW    APPearance CLEAR  CLEAR    Specific Gravity, Urine 1.027  1.005 - 1.030    pH 5.5  5.0 - 8.0    Glucose, UA NEGATIVE  NEGATIVE (mg/dL)   Hgb urine dipstick NEGATIVE  NEGATIVE    Bilirubin Urine NEGATIVE  NEGATIVE    Ketones, ur NEGATIVE  NEGATIVE (mg/dL)   Protein, ur NEGATIVE  NEGATIVE (mg/dL)   Urobilinogen, UA 0.2  0.0 - 1.0 (mg/dL)   Nitrite NEGATIVE  NEGATIVE    Leukocytes, UA SMALL (*) NEGATIVE   PREGNANCY, URINE     Status: Normal   Collection Time   12/07/11  1:53 AM      Component Value Range   Preg Test, Ur NEGATIVE  NEGATIVE   URINE MICROSCOPIC-ADD  ON     Status: Abnormal   Collection Time   12/07/11  1:53 AM      Component Value Range   Squamous Epithelial / LPF MANY (*) RARE    WBC, UA 11-20  <3 (WBC/hpf)   RBC / HPF 0-2  <3 (RBC/hpf)   Bacteria, UA MANY (*) RARE    Urine-Other MUCOUS PRESENT    POCT I-STAT 3, BLOOD GAS (G3+)     Status: Abnormal   Collection Time   12/07/11  2:33 AM      Component Value Range   pH, Arterial 7.355  7.350 - 7.400    pCO2 arterial 43.2  35.0 - 45.0 (mmHg)   pO2, Arterial 394.0 (*) 80.0 - 100.0 (mmHg)   Bicarbonate 24.2 (*) 20.0 - 24.0 (mEq/L)   TCO2 25  0 - 100 (mmol/L)   O2 Saturation 100.0     Acid-base deficit 2.0  0.0 - 2.0 (mmol/L)   Patient temperature 98.5 F     Collection site RADIAL, ALLEN'S TEST ACCEPTABLE     Drawn by RT     Sample type ARTERIAL      Disposition: home today.   Follow-up Appts: Discharge Orders    Future Orders Please Complete By Expires   Diet - low sodium heart healthy      Increase activity slowly           Tests Needing Follow-up: CPK/bmp.  Time spent in discharge (includes decision making & examination of pt): 35 minutes  Signed: Chenika Nevils 12/07/2011, 11:13 AM
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
## 2012-11-14 ENCOUNTER — Telehealth: Payer: Self-pay | Admitting: Gynecology

## 2012-11-14 NOTE — Telephone Encounter (Signed)
Patient requesting call back, having swelling in face and stomach. Wants to know if uribel or her IUD is causing this. Please Advise

## 2012-11-15 ENCOUNTER — Telehealth: Payer: Self-pay | Admitting: Orthopedic Surgery

## 2012-11-15 NOTE — Telephone Encounter (Signed)
Patient stated stopped the Uribel after 3 days. Face / hands/ swelling and felt like throat closing up. Nausea. Explained note from Dr. Farrel Gobble of need to have evaluation with urologist . Also patient asking about the repeat or follow up of sonnagram done showing left ovary cyst. . Patient also asking should she continue oct of Lo Estrin Fe? Please advise. Patient would like a calll back from you if you can. Patient was instructed to take Benedryl now for allergic symptoms she is having and to go to urgent care or ER if allergic symptoms persist. Carolyn Jennings

## 2012-11-15 NOTE — Telephone Encounter (Signed)
Stop uribel, swelling not from IUD, agree that evaluation by urology seems appropriate

## 2012-11-15 NOTE — Telephone Encounter (Signed)
Stop uribel, not from IUD, agree that visit to urology seems appropriate.  If she wants, I will call her later

## 2012-11-15 NOTE — Telephone Encounter (Signed)
Addendum from telephone call from 11-14-12 at 12:11 that was accidentally closed prior to completing. Pt. Wondering if symptoms could be related to Uribel or her IUD. LMTCB for pt. To get more info.

## 2012-12-04 ENCOUNTER — Other Ambulatory Visit: Payer: Self-pay | Admitting: Gynecology

## 2012-12-04 DIAGNOSIS — N83209 Unspecified ovarian cyst, unspecified side: Secondary | ICD-10-CM

## 2012-12-04 DIAGNOSIS — Z975 Presence of (intrauterine) contraceptive device: Secondary | ICD-10-CM

## 2012-12-17 ENCOUNTER — Ambulatory Visit (INDEPENDENT_AMBULATORY_CARE_PROVIDER_SITE_OTHER): Payer: Commercial Managed Care - PPO | Admitting: Gynecology

## 2012-12-17 ENCOUNTER — Other Ambulatory Visit: Payer: Self-pay

## 2012-12-17 ENCOUNTER — Ambulatory Visit (INDEPENDENT_AMBULATORY_CARE_PROVIDER_SITE_OTHER): Payer: Commercial Managed Care - PPO

## 2012-12-17 DIAGNOSIS — T889XXS Complication of surgical and medical care, unspecified, sequela: Secondary | ICD-10-CM

## 2012-12-17 DIAGNOSIS — N921 Excessive and frequent menstruation with irregular cycle: Secondary | ICD-10-CM

## 2012-12-17 DIAGNOSIS — Z975 Presence of (intrauterine) contraceptive device: Secondary | ICD-10-CM

## 2012-12-17 DIAGNOSIS — N854 Malposition of uterus: Secondary | ICD-10-CM

## 2012-12-17 DIAGNOSIS — N923 Ovulation bleeding: Secondary | ICD-10-CM

## 2012-12-17 DIAGNOSIS — N83209 Unspecified ovarian cyst, unspecified side: Secondary | ICD-10-CM

## 2012-12-17 DIAGNOSIS — F17200 Nicotine dependence, unspecified, uncomplicated: Secondary | ICD-10-CM

## 2012-12-17 DIAGNOSIS — T5891XA Toxic effect of carbon monoxide from unspecified source, accidental (unintentional), initial encounter: Secondary | ICD-10-CM

## 2012-12-17 DIAGNOSIS — T8339XS Other mechanical complication of intrauterine contraceptive device, sequela: Secondary | ICD-10-CM

## 2012-12-17 DIAGNOSIS — T8389XA Other specified complication of genitourinary prosthetic devices, implants and grafts, initial encounter: Secondary | ICD-10-CM

## 2012-12-17 DIAGNOSIS — N83202 Unspecified ovarian cyst, left side: Secondary | ICD-10-CM

## 2012-12-17 NOTE — Progress Notes (Signed)
Pt here for follow up of ovarian cyst 6cm on left pt reports bloating and pain have resolved last week and she isfeeling  More like herself.  Pt is on oc to help shrink cyst.  U/s finding discussed with pt, cyst now resolved. Pt reports still smoking. Recommend pt stop oc due to excessive risk.of stroke and MI.  Pt assured that IUD not causative of cyst.  Pt has now stopped bleeding.   Recommend we watch bleeding profile for next several months- pt is agreeable.  Pt recently seen in ER for carbon monoxide poisoning, states she had portable heating system in bathroom that was linked to propane tank with poor seal.  Pt denies any intension of trying to kill herself, "I have to take care of my girls".  She states that she is in the process of trying to get her life in order and move on.  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  We strongly recommend she reach out for help if she did indeed try to commit suicide and that she leave her home if she feels unsafe.  Length of additional conversation re ER visit, 25 min, total time spend >40 min >50% face to face discussing her personal safety, risks of tobacco use and ovarian cysts

## 2012-12-18 ENCOUNTER — Encounter: Payer: Self-pay | Admitting: Gynecology

## 2013-02-24 ENCOUNTER — Telehealth: Payer: Self-pay | Admitting: Gynecology

## 2013-02-24 NOTE — Telephone Encounter (Signed)
Patient calling stating she is having "severe bleeding, stomach cramping, and a fever." Wants to speak with nurse please.

## 2013-02-24 NOTE — Telephone Encounter (Signed)
Spoke with pt who reports she has had bleeding off and on since having IUD placed in February. Pt also reports her abdomen was distended for quite a while "like I was 9 months pregnant." Pt also has had right side pain that caused her to seek help from an accupuncturist 2-3 times. Pt reports she was at the accupuncturist's office for 3 hours at her first visit, and finally got relief from the pain and distension. Accupuncturist started her on herbal remedies. Over the weekend, pt experienced vomiting, sweating, weakness, "had to stay in bed." Pt's bleeding was like a mild period over the weekend. Bleeding today is only spotting. Pt thinks the vomiting and sweating could have been the herbal remedies "releasing toxins from her body." Pt says she is not going back to the accupuncturist. Pt feeling better today, no sweating or vomiting. Pt would like to see Dr. Farrel Gobble about the continued bleeding. Advised TL on vacation all this week, and offered OV with CR today. Pt refused, saying she doesn't like to switch doctors, and that TL knows her the best. Advised pt to go to Urgent Care or ER if symptoms worsen again. Pt agreeable. Sched OV with TL 03-03-13  at 11:45 per pt request.

## 2013-02-28 DIAGNOSIS — IMO0002 Reserved for concepts with insufficient information to code with codable children: Secondary | ICD-10-CM | POA: Insufficient documentation

## 2013-02-28 DIAGNOSIS — D7589 Other specified diseases of blood and blood-forming organs: Secondary | ICD-10-CM | POA: Insufficient documentation

## 2013-02-28 DIAGNOSIS — K760 Fatty (change of) liver, not elsewhere classified: Secondary | ICD-10-CM | POA: Insufficient documentation

## 2013-03-03 ENCOUNTER — Ambulatory Visit (INDEPENDENT_AMBULATORY_CARE_PROVIDER_SITE_OTHER): Payer: Commercial Managed Care - PPO | Admitting: Gynecology

## 2013-03-03 ENCOUNTER — Encounter: Payer: Self-pay | Admitting: Gynecology

## 2013-03-03 VITALS — BP 140/80 | HR 72 | Resp 14 | Ht 61.5 in | Wt 127.0 lb

## 2013-03-03 DIAGNOSIS — R102 Pelvic and perineal pain: Secondary | ICD-10-CM

## 2013-03-03 DIAGNOSIS — R141 Gas pain: Secondary | ICD-10-CM

## 2013-03-03 DIAGNOSIS — Z87898 Personal history of other specified conditions: Secondary | ICD-10-CM

## 2013-03-03 DIAGNOSIS — R142 Eructation: Secondary | ICD-10-CM

## 2013-03-03 DIAGNOSIS — T889XXS Complication of surgical and medical care, unspecified, sequela: Secondary | ICD-10-CM

## 2013-03-03 DIAGNOSIS — Z8742 Personal history of other diseases of the female genital tract: Secondary | ICD-10-CM

## 2013-03-03 DIAGNOSIS — R140 Abdominal distension (gaseous): Secondary | ICD-10-CM

## 2013-03-03 DIAGNOSIS — T8339XS Other mechanical complication of intrauterine contraceptive device, sequela: Secondary | ICD-10-CM

## 2013-03-03 DIAGNOSIS — N949 Unspecified condition associated with female genital organs and menstrual cycle: Secondary | ICD-10-CM

## 2013-03-03 NOTE — Progress Notes (Signed)
Subjective:     Patient ID: Carolyn Jennings, female   DOB: Feb 07, 1971, 42 y.o.   MRN: [id number]  HPI Comments: Pt went to see accupunturist 3d after the bleeding began, fwelt better initially and was given a "tea" but the pain recurred and went back 6/26 and had needles replaced, felt weak and sweaty, given some herbal treatments.  Pt reports nausea and fever after as well as weakness.  Pt unsure if second treatment helped with pain.  Pt went to [facility] 02/27/2013.  Pt stopped the Latuda 2w ago so medications did not interfer with herbs. Pt has u/s which showed a small simple right ovarian cyst 2.2cm. Pt feels at this point she would like to have the IUD removed, feels that her pelvic pain and bloating are related to the IUD. Last sex was not associated with sex  Vaginal Bleeding The patient's primary symptoms include pelvic pain (began with flow) and vaginal bleeding (began 02/10/2013). This is a chronic problem. The current episode started 1 to 4 weeks ago. The problem occurs daily. The problem has been gradually worsening. The pain is moderate. The problem affects the right side. She is not pregnant. Associated symptoms include abdominal pain (bloating).     Review of Systems  Constitutional: Positive for unexpected weight change (gain).  Gastrointestinal: Positive for abdominal pain (bloating) and abdominal distention.  Genitourinary: Positive for vaginal bleeding and pelvic pain (began with flow).       Objective:   Physical Exam  Constitutional: She is oriented to person, place, and time. She appears well-developed and well-nourished.  Neurological: She is alert and oriented to person, place, and time.  Skin: Skin is warm and dry.  Pelvic exam:  VULVA: normal appearing vulva with no masses, tenderness or lesions,  VAGINA: normal appearing vagina with normal color and discharge, no lesions,  CERVIX: no lesions or bleeding noted, IUD strings seen,  UTERUS: uterus is normal size,  shape, consistency and nontender,  ADNEXA: normal adnexa in size, nontender and no masses.      Assessment:     Pelvic pain IUD in place Bloating PAP due      Plan:     IUD removed, pt informed weight gain and bloating more likely GI and not from IUD, pt insisted removal- stings seen grasped and IUD removed with ease, shown to pt Recommend pt f/u with PCP regarding GI bloating To watch symptoms PAP done-reviewed prior pap's, pt concerned that she needs hysterectomy as was offered from prior GYN.  Pt informed worst PAP, CIN I was seen only, last ASCUS.  Pt assured her level of cervical dysplasia was not indication for hysterectomy, side effects from surgery were reviewed and undesirable to pt.  We discussed treating her cervix if she continues to have PCB, no bleeding was seen today Length of time spent on discussing ovarian cysts, pelvic pain and IUD 60m, >50% face to face

## 2013-03-05 LAB — IPS PAP TEST WITH HPV

## 2013-03-10 ENCOUNTER — Other Ambulatory Visit: Payer: Self-pay | Admitting: Family Medicine

## 2013-03-10 ENCOUNTER — Ambulatory Visit
Admission: RE | Admit: 2013-03-10 | Discharge: 2013-03-10 | Disposition: A | Discharge status: Home / Self Care | Payer: Medicare Other | Source: Ambulatory Visit | Attending: Family Medicine | Admitting: Family Medicine

## 2013-03-10 DIAGNOSIS — R1012 Left upper quadrant pain: Secondary | ICD-10-CM

## 2013-03-13 ENCOUNTER — Other Ambulatory Visit: Payer: Self-pay | Admitting: Gastroenterology

## 2013-03-13 DIAGNOSIS — R109 Unspecified abdominal pain: Secondary | ICD-10-CM

## 2013-03-14 ENCOUNTER — Telehealth: Payer: Self-pay | Admitting: Gynecology

## 2013-03-14 ENCOUNTER — Other Ambulatory Visit: Payer: Medicare Other

## 2013-03-14 NOTE — Telephone Encounter (Signed)
Patient is calling to find out some more information concerning her IUD. She has had blood work done and was told she has a fatty liver . Wants to know if it is from the IUD. Dr. Clelia Croft had told her that it had thrown her body out of wack.

## 2013-03-16 NOTE — Telephone Encounter (Signed)
IUD cannot cause fatty liver

## 2013-03-18 NOTE — Telephone Encounter (Signed)
LMTCB  aa 

## 2013-03-18 NOTE — Telephone Encounter (Signed)
Spoke with pt to advise IUD cannot cause fatty liver problems. Pt appreciative. Will follow up with her PCP as needed.

## 2013-03-20 ENCOUNTER — Other Ambulatory Visit: Payer: Medicare Other

## 2013-06-17 ENCOUNTER — Emergency Department: Payer: Self-pay | Admitting: Emergency Medicine

## 2014-03-21 ENCOUNTER — Emergency Department (HOSPITAL_COMMUNITY)
Admission: EM | Admit: 2014-03-21 | Discharge: 2014-03-21 | Disposition: A | Discharge status: Home / Self Care | Payer: Commercial Managed Care - PPO | Attending: Emergency Medicine | Admitting: Emergency Medicine

## 2014-03-21 ENCOUNTER — Emergency Department (HOSPITAL_COMMUNITY): Payer: Commercial Managed Care - PPO

## 2014-03-21 ENCOUNTER — Encounter (HOSPITAL_COMMUNITY): Payer: Self-pay | Admitting: Emergency Medicine

## 2014-03-21 ENCOUNTER — Telehealth (HOSPITAL_BASED_OUTPATIENT_CLINIC_OR_DEPARTMENT_OTHER): Payer: Self-pay | Admitting: Emergency Medicine

## 2014-03-21 DIAGNOSIS — F3289 Other specified depressive episodes: Secondary | ICD-10-CM | POA: Insufficient documentation

## 2014-03-21 DIAGNOSIS — Y92410 Unspecified street and highway as the place of occurrence of the external cause: Secondary | ICD-10-CM | POA: Insufficient documentation

## 2014-03-21 DIAGNOSIS — F329 Major depressive disorder, single episode, unspecified: Secondary | ICD-10-CM | POA: Insufficient documentation

## 2014-03-21 DIAGNOSIS — F411 Generalized anxiety disorder: Secondary | ICD-10-CM | POA: Insufficient documentation

## 2014-03-21 DIAGNOSIS — S6000XA Contusion of unspecified finger without damage to nail, initial encounter: Secondary | ICD-10-CM | POA: Insufficient documentation

## 2014-03-21 DIAGNOSIS — IMO0002 Reserved for concepts with insufficient information to code with codable children: Secondary | ICD-10-CM | POA: Diagnosis not present

## 2014-03-21 DIAGNOSIS — Y9389 Activity, other specified: Secondary | ICD-10-CM | POA: Diagnosis not present

## 2014-03-21 DIAGNOSIS — F17200 Nicotine dependence, unspecified, uncomplicated: Secondary | ICD-10-CM | POA: Diagnosis not present

## 2014-03-21 DIAGNOSIS — S0990XA Unspecified injury of head, initial encounter: Secondary | ICD-10-CM | POA: Diagnosis not present

## 2014-03-21 DIAGNOSIS — T148XXA Other injury of unspecified body region, initial encounter: Secondary | ICD-10-CM

## 2014-03-21 MED ORDER — HYDROCODONE-ACETAMINOPHEN 5-325 MG PO TABS
1.0000 | ORAL_TABLET | Freq: Once | ORAL | Status: AC
  Administered 2014-03-21: 1 via ORAL
  Filled 2014-03-21: qty 1

## 2014-03-21 NOTE — ED Notes (Signed)
EDP here to see Pt.

## 2014-03-21 NOTE — ED Notes (Signed)
Pt. Returned from X-ray. Per sitter Pt. Refused both procedures.
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
## 2014-03-21 NOTE — ED Notes (Signed)
Pt attempting to leave, MD notified.
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
## 2014-03-21 NOTE — ED Notes (Signed)
Patient requesting to go home.  Sitting in a wheelchair waiting to go home

## 2014-03-21 NOTE — ED Notes (Signed)
Patient transported to X-ray 
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
## 2014-03-21 NOTE — ED Notes (Signed)
Patients wallet and cell phone are with [facility] vehicle [facility]

## 2014-03-21 NOTE — ED Notes (Signed)
Pt. Came back from XR went straight to phone and picked it up. Instead of calling her daughter like she has been asking to do she called a man named Casimiro NeedleMichael to come pick her up at the ED. She left him 2 messages. Call placed to Dr. Rhunette CroftNanavati about Pt. Not getting her xray completed. States he will come see Pt.
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
## 2014-06-02 ENCOUNTER — Telehealth: Payer: Self-pay | Admitting: Gynecology

## 2014-06-02 NOTE — Telephone Encounter (Signed)
Tried to reach patient at number provided [phone]. Patient did not answer and the voicemail box has not been set up yet. Tried to reach patient at home number provided [phone] recording states to check the number and retry. Retried the number 3 times with same recording.

## 2014-06-02 NOTE — Telephone Encounter (Signed)
Patient calling stating she "may have a urinary tract infection." She wants to wait to be seen until her AEX 06/15/14. I offered patient an appointment tomorrow with Dr. Farrel GobbleLathrop but she declined as her car is not working. Please advise patient.

## 2014-06-03 ENCOUNTER — Telehealth: Payer: Self-pay | Admitting: Gynecology

## 2014-06-03 ENCOUNTER — Ambulatory Visit: Payer: Medicare Other | Admitting: Gynecology

## 2014-06-03 NOTE — Telephone Encounter (Signed)
Pt called to let you know she will keep her appointment for Friday at 2:30.

## 2014-06-03 NOTE — Telephone Encounter (Signed)
Spoke with patient. Appointment scheduled for Friday at 2:30pm with Dr.Lathrop. Patient is agreeable to date and time. States "I am going to have to find a ride because my car is messed up. If I am not able to I will call back to cancel."  Routing to provider for final review. Patient agreeable to disposition. Will close encounter

## 2014-06-03 NOTE — Telephone Encounter (Signed)
Spoke with patient. Patient states that she was having urinary frequency for around one month. States that it resolved last week. Before starting her cycle she noticed two raw areas on the inside of her vagina on the right side. "They do not look like blisters. I don't know if they are cuts or just raw spots." Advised patient that we will need to see her before aex on 10/19 to check to make sure she does not have continued UTI and to check the area she is speaking of. Patient is agreeable but states that she does not have a car right now and can only come in on Friday. Declines to see another provider. Advised will have to speak with Dr.Lathrop and give her a call back regarding best appointment date and time. Patient is agreeable.

## 2014-06-05 ENCOUNTER — Telehealth: Payer: Self-pay | Admitting: Gynecology

## 2014-06-05 ENCOUNTER — Ambulatory Visit: Payer: Medicare Other | Admitting: Gynecology

## 2014-06-05 NOTE — Telephone Encounter (Signed)
Voice mail not set-up yet.

## 2014-06-05 NOTE — Telephone Encounter (Signed)
Patient called and left message on the voicemail at lunch cancelling her appointment with Dr. Farrel GobbleLathrop for this afternoon due to "no transportation." I tried to reach the patient back to reschedule but her voicemail was full. I did not charge missed appointment fee.  Cc: triage

## 2014-06-05 NOTE — Telephone Encounter (Signed)
Routing to Dr.Lathrop as FYI.  Routing to provider for final review. Patient agreeable to disposition. Will close encounter   

## 2014-06-08 NOTE — Telephone Encounter (Signed)
This was a problem visit for vaginal irritation and uti symptoms.    Tried call again. Unable to leave message. Voicemail box not set up yet.

## 2014-06-10 NOTE — Telephone Encounter (Signed)
ok 

## 2014-06-10 NOTE — Telephone Encounter (Addendum)
Called patient again. No answer and no voice mail.  Dr. Farrel GobbleLathrop, okay to close encounter?  Attempted contact x 3. We do not have updated designated party release form on file.

## 2014-06-15 ENCOUNTER — Encounter: Payer: Self-pay | Admitting: Gynecology

## 2014-06-15 ENCOUNTER — Ambulatory Visit: Payer: Medicare Other | Admitting: Gynecology

## 2014-06-29 ENCOUNTER — Encounter (HOSPITAL_COMMUNITY): Payer: Self-pay | Admitting: Emergency Medicine

## 2014-09-01 ENCOUNTER — Other Ambulatory Visit: Payer: Self-pay | Admitting: Obstetrics & Gynecology

## 2014-09-02 LAB — CYTOLOGY - PAP

## 2014-10-08 ENCOUNTER — Other Ambulatory Visit: Payer: Self-pay | Admitting: Obstetrics & Gynecology

## 2014-10-08 DIAGNOSIS — R928 Other abnormal and inconclusive findings on diagnostic imaging of breast: Secondary | ICD-10-CM

## 2014-10-20 ENCOUNTER — Other Ambulatory Visit: Payer: Self-pay | Admitting: Obstetrics & Gynecology

## 2014-10-20 ENCOUNTER — Ambulatory Visit
Admission: RE | Admit: 2014-10-20 | Discharge: 2014-10-20 | Disposition: A | Discharge status: Home / Self Care | Payer: Medicare Other | Source: Ambulatory Visit | Attending: Obstetrics & Gynecology | Admitting: Obstetrics & Gynecology

## 2014-10-20 DIAGNOSIS — N6320 Unspecified lump in the left breast, unspecified quadrant: Secondary | ICD-10-CM

## 2014-10-20 DIAGNOSIS — R928 Other abnormal and inconclusive findings on diagnostic imaging of breast: Secondary | ICD-10-CM

## 2014-11-03 ENCOUNTER — Ambulatory Visit
Admission: RE | Admit: 2014-11-03 | Discharge: 2014-11-03 | Disposition: A | Discharge status: Home / Self Care | Payer: Medicare Other | Source: Ambulatory Visit | Attending: Obstetrics & Gynecology | Admitting: Obstetrics & Gynecology

## 2014-11-03 ENCOUNTER — Other Ambulatory Visit: Payer: Self-pay | Admitting: Obstetrics & Gynecology

## 2014-11-03 DIAGNOSIS — N6320 Unspecified lump in the left breast, unspecified quadrant: Secondary | ICD-10-CM

## 2014-11-03 DIAGNOSIS — R928 Other abnormal and inconclusive findings on diagnostic imaging of breast: Secondary | ICD-10-CM

## 2014-11-06 ENCOUNTER — Encounter (HOSPITAL_COMMUNITY): Payer: Self-pay | Admitting: Emergency Medicine

## 2014-11-06 ENCOUNTER — Ambulatory Visit (HOSPITAL_COMMUNITY)
Admission: RE | Admit: 2014-11-06 | Discharge: 2014-11-06 | Disposition: A | Discharge status: Home / Self Care | Payer: 59 | Attending: Psychiatry | Admitting: Psychiatry

## 2014-11-06 ENCOUNTER — Emergency Department (HOSPITAL_COMMUNITY)
Admission: EM | Admit: 2014-11-06 | Discharge: 2014-11-08 | Disposition: A | Discharge status: Home / Self Care | Payer: Medicare Other | Attending: Emergency Medicine | Admitting: Emergency Medicine

## 2014-11-06 DIAGNOSIS — F309 Manic episode, unspecified: Secondary | ICD-10-CM | POA: Insufficient documentation

## 2014-11-06 DIAGNOSIS — F3131 Bipolar disorder, current episode depressed, mild: Secondary | ICD-10-CM | POA: Diagnosis not present

## 2014-11-06 DIAGNOSIS — F419 Anxiety disorder, unspecified: Secondary | ICD-10-CM | POA: Insufficient documentation

## 2014-11-06 DIAGNOSIS — Z79899 Other long term (current) drug therapy: Secondary | ICD-10-CM | POA: Insufficient documentation

## 2014-11-06 DIAGNOSIS — Z880 Allergy status to penicillin: Secondary | ICD-10-CM | POA: Insufficient documentation

## 2014-11-06 DIAGNOSIS — F99 Mental disorder, not otherwise specified: Secondary | ICD-10-CM | POA: Insufficient documentation

## 2014-11-06 DIAGNOSIS — F17210 Nicotine dependence, cigarettes, uncomplicated: Secondary | ICD-10-CM | POA: Diagnosis not present

## 2014-11-06 DIAGNOSIS — F1510 Other stimulant abuse, uncomplicated: Secondary | ICD-10-CM | POA: Insufficient documentation

## 2014-11-06 DIAGNOSIS — Z720 Tobacco use: Secondary | ICD-10-CM | POA: Diagnosis not present

## 2014-11-06 DIAGNOSIS — G43909 Migraine, unspecified, not intractable, without status migrainosus: Secondary | ICD-10-CM | POA: Insufficient documentation

## 2014-11-06 DIAGNOSIS — R45851 Suicidal ideations: Secondary | ICD-10-CM

## 2014-11-06 DIAGNOSIS — F1310 Sedative, hypnotic or anxiolytic abuse, uncomplicated: Secondary | ICD-10-CM | POA: Diagnosis not present

## 2014-11-06 DIAGNOSIS — Z008 Encounter for other general examination: Secondary | ICD-10-CM | POA: Diagnosis present

## 2014-11-06 DIAGNOSIS — F1910 Other psychoactive substance abuse, uncomplicated: Secondary | ICD-10-CM

## 2014-11-06 LAB — CBC
HEMATOCRIT: 42 % (ref 36.0–46.0)
HEMOGLOBIN: 14.5 g/dL (ref 12.0–15.0)
MCH: 33.2 pg (ref 26.0–34.0)
MCHC: 34.5 g/dL (ref 30.0–36.0)
MCV: 96.1 fL (ref 78.0–100.0)
Platelets: 254 10*3/uL (ref 150–400)
RBC: 4.37 MIL/uL (ref 3.87–5.11)
RDW: 13.3 % (ref 11.5–15.5)
WBC: 9.8 10*3/uL (ref 4.0–10.5)

## 2014-11-06 LAB — URINALYSIS, ROUTINE W REFLEX MICROSCOPIC
BILIRUBIN URINE: NEGATIVE
Glucose, UA: NEGATIVE mg/dL
Ketones, ur: 40 mg/dL — AB
NITRITE: POSITIVE — AB
PH: 5.5 (ref 5.0–8.0)
Protein, ur: NEGATIVE mg/dL
SPECIFIC GRAVITY, URINE: 1.028 (ref 1.005–1.030)
Urobilinogen, UA: 0.2 mg/dL (ref 0.0–1.0)

## 2014-11-06 LAB — COMPREHENSIVE METABOLIC PANEL
ALBUMIN: 5.1 g/dL (ref 3.5–5.2)
ALT: 11 U/L (ref 0–35)
AST: 17 U/L (ref 0–37)
Alkaline Phosphatase: 54 U/L (ref 39–117)
Anion gap: 9 (ref 5–15)
BILIRUBIN TOTAL: 0.9 mg/dL (ref 0.3–1.2)
BUN: 14 mg/dL (ref 6–23)
CHLORIDE: 107 mmol/L (ref 96–112)
CO2: 22 mmol/L (ref 19–32)
CREATININE: 0.79 mg/dL (ref 0.50–1.10)
Calcium: 9.3 mg/dL (ref 8.4–10.5)
GFR calc Af Amer: >90 mL/min (ref >90)
Glucose, Bld: 92 mg/dL (ref 70–99)
Potassium: 3.8 mmol/L (ref 3.5–5.1)
Sodium: 138 mmol/L (ref 135–145)
Total Protein: 7.7 g/dL (ref 6.0–8.3)

## 2014-11-06 LAB — URINE MICROSCOPIC-ADD ON

## 2014-11-06 LAB — ETHANOL: Alcohol, Ethyl (B): <5 mg/dL (ref 0–9)

## 2014-11-06 LAB — ACETAMINOPHEN LEVEL: Acetaminophen (Tylenol), Serum: <10 ug/mL — ABNORMAL LOW (ref 10–30)

## 2014-11-06 LAB — RAPID URINE DRUG SCREEN, HOSP PERFORMED
AMPHETAMINES: POSITIVE — AB
BENZODIAZEPINES: POSITIVE — AB
Barbiturates: NOT DETECTED
Cocaine: NOT DETECTED
Opiates: NOT DETECTED
Tetrahydrocannabinol: NOT DETECTED

## 2014-11-06 LAB — SALICYLATE LEVEL

## 2014-11-06 MED ORDER — LORAZEPAM 1 MG PO TABS
1.0000 mg | ORAL_TABLET | Freq: Once | ORAL | Status: AC
  Administered 2014-11-06: 1 mg via ORAL
  Filled 2014-11-06: qty 1

## 2014-11-06 MED ORDER — NICOTINE 21 MG/24HR TD PT24
21.0000 mg | MEDICATED_PATCH | Freq: Every day | TRANSDERMAL | Status: DC
Start: 2014-11-06 — End: 2014-11-08
  Administered 2014-11-06 – 2014-11-08 (×3): 21 mg via TRANSDERMAL
  Filled 2014-11-06 (×3): qty 1

## 2014-11-06 MED ORDER — ALUM & MAG HYDROXIDE-SIMETH 200-200-20 MG/5ML PO SUSP: 30.0000 mL | ORAL | Status: DC | PRN

## 2014-11-06 MED ORDER — LORAZEPAM 1 MG PO TABS: 1.0000 mg | ORAL_TABLET | Freq: Three times a day (TID) | ORAL | Status: DC | PRN

## 2014-11-06 MED ORDER — CIPROFLOXACIN HCL 500 MG PO TABS: 500.0000 mg | ORAL_TABLET | Freq: Once | ORAL | Status: DC

## 2014-11-06 MED ORDER — LORAZEPAM 1 MG PO TABS: 1.0000 mg | ORAL_TABLET | Freq: Once | ORAL | Status: AC

## 2014-11-06 MED ORDER — ONDANSETRON HCL 4 MG PO TABS: 4.0000 mg | ORAL_TABLET | Freq: Three times a day (TID) | ORAL | Status: DC | PRN

## 2014-11-06 MED ORDER — CIPROFLOXACIN HCL 500 MG PO TABS
500.0000 mg | ORAL_TABLET | Freq: Two times a day (BID) | ORAL | Status: DC
  Administered 2014-11-06 – 2014-11-08 (×4): 500 mg via ORAL
  Filled 2014-11-06 (×4): qty 1

## 2014-11-06 MED ORDER — ZOLPIDEM TARTRATE 5 MG PO TABS: 5.0000 mg | ORAL_TABLET | Freq: Every evening | ORAL | Status: DC | PRN

## 2014-11-06 NOTE — ED Notes (Signed)
Report received from Jan RN. Pt. Alert and oriented in no distress denies SI, HI, AVH and pain.  Pt. Instructed to come to me with problems or concerns.Will continue to monitor for safety via security cameras and Q 15 minute checks.
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
## 2014-11-06 NOTE — ED Notes (Addendum)
Pt states she wants help for her bipolar disorder. Pt states she wants out from two people. Pt states she can't take anymore, when asked pt just states she cant do it anymore.. Pt seems to be rambling on about different things. Pt expresses thoughts of SI because she wants out. Pt keeps saying " I have a right to leave."Pt as a plan but states she is not going to tell us.

## 2014-11-06 NOTE — ED Provider Notes (Signed)
CSN: [id number]     Arrival date & time 11/06/14  1506 History   First MD Initiated Contact with Patient 11/06/14 1559     Chief Complaint  Patient presents with  . Medical Clearance  . Depression     (Consider location/radiation/quality/duration/timing/severity/associated sxs/prior Treatment) HPI Comments: 44 year old female past medical history of bipolar disorder presenting with suicidal ideation and "help with my bipolar disorder". History is limited due to patient's manic behavior and tangential speech. Level V caveat secondary to psychiatric disorder. States she "wants out from 2 people, including Kathlene November". States she "can't take it any more and can't do it anymore". Admits to a plan of suicide, however states "I will not tell you my plan, my kids will get what they need". States her medications are not working.  The history is provided by the patient.    Past Medical History  Diagnosis Date  . Bipolar 1 disorder   . Anxiety   . Depression   . ASCUS with positive high risk HPV 2007  . Migraines    History reviewed. No pertinent past surgical history. Family History  Problem Relation Age of Onset  . Heart disease Father   . Hypertension Father   . Heart attack Father   . Diabetes Mother     Type 1   History  Substance Use Topics  . Smoking status: Current Every Day Smoker -- 1.50 packs/day for 15 years    Types: Cigarettes  . Smokeless tobacco: Not on file  . Alcohol Use: 1.1 oz/week    1 Glasses of wine, 1 Standard drinks or equivalent per week     Comment: every week   OB History    Gravida Para Term Preterm AB TAB SAB Ectopic Multiple Living   Review of Systems  Unable to perform ROS: Psychiatric disorder      Allergies  Uribel; Ampicillin; and Diflucan  Home Medications   Prior to Admission medications   Medication Sig Start Date End Date Taking? Authorizing Provider  amphetamine-dextroamphetamine (ADDERALL) 30 MG tablet Take 30 mg  by mouth 2 (two) times daily.   Yes Historical Provider, MD  clonazePAM (KLONOPIN) 1 MG tablet Take 1 mg by mouth 2 (two) times daily. 10/12/14  Yes Historical Provider, MD   BP 104/55 mmHg  Pulse 73  Temp(Src) 98.7 F (37.1 C) (Oral)  Resp 18  SpO2 99% Physical Exam  Constitutional: She is oriented to person, place, and time. She appears well-developed and well-nourished. No distress.  HENT:  Head: Normocephalic and atraumatic.  Mouth/Throat: Oropharynx is clear and moist.  Eyes: Conjunctivae and EOM are normal.  Neck: Normal range of motion. Neck supple.  Cardiovascular: Normal rate, regular rhythm and normal heart sounds.   Pulmonary/Chest: Effort normal and breath sounds normal. No respiratory distress.  Musculoskeletal: Normal range of motion. She exhibits no edema.  Neurological: She is alert and oriented to person, place, and time. No sensory deficit.  Skin: Skin is warm and dry.  Psychiatric: Her mood appears anxious. Her speech is tangential. She is hyperactive. She exhibits a depressed mood. She expresses suicidal ideation. She expresses no homicidal ideation. She expresses suicidal plans.  Tearful.  Nursing note and vitals reviewed.   ED Course  Procedures (including critical care time) Labs Review Labs Reviewed  ACETAMINOPHEN LEVEL - Abnormal; Notable for the following:    Acetaminophen (Tylenol), Serum <10.0 (*)    All other components  within normal limits  URINE RAPID DRUG SCREEN (HOSP PERFORMED) - Abnormal; Notable for the following:    Benzodiazepines POSITIVE (*)    Amphetamines POSITIVE (*)    All other components within normal limits  URINALYSIS, ROUTINE W REFLEX MICROSCOPIC - Abnormal; Notable for the following:    Color, Urine AMBER (*)    APPearance CLOUDY (*)    Hgb urine dipstick TRACE (*)    Ketones, ur 40 (*)    Nitrite POSITIVE (*)    Leukocytes, UA SMALL (*)    All other components within normal limits  URINE MICROSCOPIC-ADD ON - Abnormal;  Notable for the following:    Bacteria, UA MANY (*)    All other components within normal limits  CBC  COMPREHENSIVE METABOLIC PANEL  ETHANOL  SALICYLATE LEVEL  POC URINE PREG, ED    Imaging Review No results found.   EKG Interpretation None      MDM   Final diagnoses:  Suicidal ideation  Psychiatric disorder   NAD. AFVSS. Urine positive for infection. Pt given first dose of cipro (PCN allergy), ordered BID for the next 5 days. Otherwise medically cleared. Assessed by TTS, plan to have psych consult pt in the morning. Dispo pending psych consult.  Kathrynn SpeedRobyn M Terrance Usery, PA-C 11/07/14 0038  Arby BarretteMarcy Pfeiffer, MD 11/07/14 1524
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
## 2014-11-07 DIAGNOSIS — R45851 Suicidal ideations: Secondary | ICD-10-CM

## 2014-11-07 DIAGNOSIS — Z8659 Personal history of other mental and behavioral disorders: Secondary | ICD-10-CM

## 2014-11-07 DIAGNOSIS — F99 Mental disorder, not otherwise specified: Secondary | ICD-10-CM

## 2014-11-07 MED ORDER — ACETAMINOPHEN 325 MG PO TABS
650.0000 mg | ORAL_TABLET | Freq: Four times a day (QID) | ORAL | Status: DC | PRN
  Administered 2014-11-07 – 2014-11-08 (×3): 650 mg via ORAL
  Filled 2014-11-07 (×3): qty 2

## 2014-11-07 MED ORDER — ARIPIPRAZOLE 5 MG PO TABS
5.0000 mg | ORAL_TABLET | Freq: Two times a day (BID) | ORAL | Status: DC
  Administered 2014-11-07 – 2014-11-08 (×3): 5 mg via ORAL
  Filled 2014-11-07 (×4): qty 1

## 2014-11-07 MED ORDER — HYDROXYZINE HCL 25 MG PO TABS
25.0000 mg | ORAL_TABLET | Freq: Four times a day (QID) | ORAL | Status: DC | PRN
  Administered 2014-11-07 – 2014-11-08 (×2): 25 mg via ORAL
  Filled 2014-11-07 (×2): qty 1

## 2014-11-07 MED ORDER — DIVALPROEX SODIUM ER 500 MG PO TB24
500.0000 mg | ORAL_TABLET | Freq: Every day | ORAL | Status: DC
Start: 2014-11-07 — End: 2014-11-08
  Administered 2014-11-07: 500 mg via ORAL
  Filled 2014-11-07 (×2): qty 1

## 2014-11-07 NOTE — Consult Note (Signed)
[facility] Face-to-Face Psychiatry Consult   Reason for Consult:  "meds not working", suicidal Referring Physician:  EDP Patient Identification: Carolyn Jennings MRN:  [id number] Principal Diagnosis: <principal problem not specified> Diagnosis:   Patient Active Problem List   Diagnosis Date Noted  . Rhabdomyolysis [M62.82] 12/07/2011  . Accidental poisoning by unspecified carbon monoxide [T58.91XA] 12/07/2011  . UTI (lower urinary tract infection) [N39.0] 12/07/2011    Total Time spent with patient: 30 minutes  Subjective:   Carolyn Jennings is a 44 y.o. female patient admitted with suicidal ideation and disorganized thinking.Marland Kitchen  HPI: Patient is a 44 year old divorced white female who lives with her 53 year old daughter. She has a past history of "Bipolar, ADHD and OCD" and used to see Dr. Dub Mikes. After he moved to inpatient she began seeing someone at [facility]. She has been tried on Prozac which made her angry and Gabapentin which caused her to be unable to move. Low dose clonazepam did not help anxiety. She has not been seen for 3 months and she has become increasingly depressed and suicidal without specific plan. She claims she did the best on a combination of Abilify and Depakote. She is very disorganized and tangential. She is positive for amphetamines and benzodiazepines. She claims she has had 2 DUIs but gives a confusing and convoluted story HPI Elements:   Location:  global. Quality:  severe. Severity:  severe. Timing:  3 months. Duration:  years. Context:  off medications.  Past Medical History:  Past Medical History  Diagnosis Date  . Bipolar 1 disorder   . Anxiety   . Depression   . ASCUS with positive high risk HPV 2007  . Migraines    History reviewed. No pertinent past surgical history. Family History:  Family History  Problem Relation Age of Onset  . Heart disease Father   . Hypertension Father   . Heart attack Father   . Diabetes Mother     Type 1    Social History:  History  Alcohol Use  . 1.1 oz/week  . 1 Glasses of wine, 1 Standard drinks or equivalent per week    Comment: every week     History  Drug Use No    History   Social History  . Marital Status: Married    Spouse Name: N/A  . Number of Children: N/A  . Years of Education: N/A   Social History Main Topics  . Smoking status: Current Every Day Smoker -- 1.50 packs/day for 15 years    Types: Cigarettes  . Smokeless tobacco: Not on file  . Alcohol Use: 1.1 oz/week    1 Glasses of wine, 1 Standard drinks or equivalent per week     Comment: every week  . Drug Use: No  . Sexual Activity: Not on file   Other Topics Concern  . None   Social History Narrative   Additional Social History:                          Allergies:   Allergies  Allergen Reactions  . Valerie Salts D-S] Swelling  . Ampicillin Rash  . Diflucan [Fluconazole] Rash    Vitals: Blood pressure 123/69, pulse 78, temperature 98.7 F (37.1 C), temperature source Oral, resp. rate 18, SpO2 100 %.  Risk to Self: Is patient at risk for suicide?: No Risk to Others:   Prior Inpatient Therapy:  hospitalized in [facility] 2011 Prior Outpatient Therapy:  Dr. Dub MikesLugo  Current Facility-Administered Medications  Medication Dose Route Frequency Provider Last Rate Last Dose  . acetaminophen (TYLENOL) tablet 650 mg  650 mg Oral Q6H PRN Charm RingsJamison Y Lord, NP   650 mg at 11/07/14 0912  . alum & mag hydroxide-simeth (MAALOX/MYLANTA) 200-200-20 MG/5ML suspension 30 mL  30 mL Oral PRN Robyn M Hess, PA-C      . ciprofloxacin (CIPRO) tablet 500 mg  500 mg Oral BID Robyn M Hess, PA-C   500 mg at 11/07/14 0900  . LORazepam (ATIVAN) tablet 1 mg  1 mg Oral Q8H PRN Robyn M Hess, PA-C      . nicotine (NICODERM CQ - dosed in mg/24 hours) patch 21 mg  21 mg Transdermal Daily Kathrynn SpeedRobyn M Hess, PA-C   21 mg at 11/07/14 0914  . ondansetron (ZOFRAN) tablet 4 mg  4 mg Oral Q8H PRN Robyn M Hess, PA-C      . zolpidem (AMBIEN)  tablet 5 mg  5 mg Oral QHS PRN Kathrynn Speedobyn M Hess, PA-C       Current Outpatient Prescriptions  Medication Sig Dispense Refill  . amphetamine-dextroamphetamine (ADDERALL) 30 MG tablet Take 30 mg by mouth 2 (two) times daily.    . clonazePAM (KLONOPIN) 1 MG tablet Take 1 mg by mouth 2 (two) times daily.  2    Musculoskeletal: Strength & Muscle Tone: within normal limits Gait & Station: normal Patient leans: N/A  Psychiatric Specialty Exam: Physical Exam  Psychiatric: Her mood appears anxious. Her speech is slurred. Thought content is delusional. She expresses impulsivity. She exhibits a depressed mood. She expresses suicidal ideation. She expresses suicidal plans. She exhibits abnormal recent memory. She is inattentive.    Review of Systems  Psychiatric/Behavioral: Positive for depression and suicidal ideas. The patient is nervous/anxious.     Blood pressure 123/69, pulse 78, temperature 98.7 F (37.1 C), temperature source Oral, resp. rate 18, SpO2 100 %.There is no weight on file to calculate BMI.  General Appearance: Casual and Fairly Groomed  Patent attorneyye Contact::  Fair  Speech:  Garbled  Volume:  Normal  Mood:  Anxious and Depressed  Affect:  Inappropriate and Labile  Thought Process:  Circumstantial, Disorganized and Tangential  Orientation:  Full (Time, Place, and Person)  Thought Content:  Obsessions, Paranoid Ideation and Rumination  Suicidal Thoughts:  Yes.  without intent/plan  Homicidal Thoughts:  No  Memory:  Immediate;   Poor Recent;   Poor Remote;   Poor  Judgement:  Impaired  Insight:  Lacking  Psychomotor Activity:  Decreased  Concentration:  Poor  Recall:  Poor  Fund of Knowledge:Fair  Language: Fair  Akathisia:  No  Handed:  Right  AIMS (if indicated):     Assets:  Communication Skills Desire for Improvement Resilience  ADL's:  Impaired  Cognition: Impaired,  Mild  Sleep:      Medical Decision Making: New problem, with additional work up planned, Review of  Psycho-Social Stressors (1), Review or order clinical lab tests (1), Decision to obtain old records (1), Review and summation of old records (2) and Review or order medicine tests (1)  Treatment Plan Summary: Daily contact with patient to assess and evaluate symptoms and progress in treatment and Medication management  Plan:  Recommend psychiatric Inpatient admission when medically cleared. Disposition: Patient is disorganized and confused, probably still intoxicated. Not stable for discharge and will need inpatient stabilization. Abilify and Depakote will be restarted.  Joclyn Alsobrook, [facility] 11/07/2014 11:20 AM

## 2014-11-07 NOTE — ED Notes (Signed)
Up to the desk requesting some thing for anxiety

## 2014-11-07 NOTE — ED Notes (Signed)
On the phone 

## 2014-11-07 NOTE — ED Notes (Signed)
Pt up in room, reports she is starting to feel anxious and is having difficutly sleeping w/ the light on.  Bed repositioned, support given.

## 2014-11-07 NOTE — ED Notes (Signed)
Pt's friend Sheralyn Boatmanoni called and reports that she is concerned about the patient leaving and feels that she needs to be admitted in order to get the medication and treatment that she needs.

## 2014-11-07 NOTE — ED Notes (Signed)
sleeping

## 2014-11-07 NOTE — ED Notes (Signed)
Report received from Lizbeth BarkJanie Rambo RN. Pt. Sleeping, respirations, regular and unlabored. Will continue to monitor for safety via security cameras and Q 15 minute checks.

## 2014-11-08 DIAGNOSIS — F3131 Bipolar disorder, current episode depressed, mild: Secondary | ICD-10-CM | POA: Diagnosis present

## 2014-11-08 DIAGNOSIS — R45851 Suicidal ideations: Secondary | ICD-10-CM

## 2014-11-08 DIAGNOSIS — F1910 Other psychoactive substance abuse, uncomplicated: Secondary | ICD-10-CM

## 2014-11-08 MED ORDER — DIVALPROEX SODIUM ER 500 MG PO TB24: 500.0000 mg | ORAL_TABLET | Freq: Every day | ORAL | Status: DC

## 2014-11-08 MED ORDER — HYDROXYZINE HCL 25 MG PO TABS: 25.0000 mg | ORAL_TABLET | Freq: Four times a day (QID) | ORAL | Status: DC | PRN

## 2014-11-08 MED ORDER — ARIPIPRAZOLE 5 MG PO TABS: 5.0000 mg | ORAL_TABLET | Freq: Two times a day (BID) | ORAL | Status: DC

## 2014-11-08 MED ORDER — CIPROFLOXACIN HCL 500 MG PO TABS: 500.0000 mg | ORAL_TABLET | Freq: Two times a day (BID) | ORAL | Status: DC

## 2014-11-08 NOTE — Consult Note (Signed)
[facility] Face-to-Face Psychiatry Consult   Reason for Consult:  Suicidal ideations Referring Physician:  EDP Patient Identification: Carolyn Jennings MRN:  [id number] Principal Diagnosis: Bipolar affective disorder, depressed, mild Diagnosis:   Patient Active Problem List   Diagnosis Date Noted  . Bipolar affective disorder, depressed, mild [F31.31] 11/08/2014    Priority: High  . Polysubstance abuse [F19.10] 11/08/2014    Priority: High  . Suicidal ideations [R45.851] 11/08/2014    Priority: High  . Rhabdomyolysis [M62.82] 12/07/2011  . Accidental poisoning by unspecified carbon monoxide [T58.91XA] 12/07/2011  . UTI (lower urinary tract infection) [N39.0] 12/07/2011    Total Time spent with patient: 30 minutes  Subjective:   Carolyn Jennings is a 44 y.o. female patient has stabilized.  HPI:  The patient states she feels "better", denies suicidal/homicidal ideations, hallucinations.  She came to the ED under the influence of amphetamines and benzodiazepines.  Raneem has cleared and her psychiatric medications were restarted.  She requests out-patient resources to continue her psychiatric care, stable for discharge. HPI Elements:   Location:  generalized. Quality:  aacute. Severity:  mild. Timing:  intermittent. Duration:  brief. Context:  stressors, drug abuse.  Past Medical History:  Past Medical History  Diagnosis Date  . Bipolar 1 disorder   . Anxiety   . Depression   . ASCUS with positive high risk HPV 2007  . Migraines    History reviewed. No pertinent past surgical history. Family History:  Family History  Problem Relation Age of Onset  . Heart disease Father   . Hypertension Father   . Heart attack Father   . Diabetes Mother     Type 1   Social History:  History  Alcohol Use  . 1.1 oz/week  . 1 Glasses of wine, 1 Standard drinks or equivalent per week    Comment: every week     History  Drug Use No    History   Social History  . Marital  Status: Married    Spouse Name: N/A  . Number of Children: N/A  . Years of Education: N/A   Social History Main Topics  . Smoking status: Current Every Day Smoker -- 1.50 packs/day for 15 years    Types: Cigarettes  . Smokeless tobacco: Not on file  . Alcohol Use: 1.1 oz/week    1 Glasses of wine, 1 Standard drinks or equivalent per week     Comment: every week  . Drug Use: No  . Sexual Activity: Not on file   Other Topics Concern  . None   Social History Narrative   Additional Social History:                          Allergies:   Allergies  Allergen Reactions  . Valerie Salts D-S] Swelling  . Ampicillin Rash  . Diflucan [Fluconazole] Rash    Vitals: Blood pressure 142/68, pulse 74, temperature 99.2 F (37.3 C), temperature source Oral, resp. rate 18, SpO2 100 %.  Risk to Self: Is patient at risk for suicide?: No Risk to Others:   Prior Inpatient Therapy:   Prior Outpatient Therapy:    Current Facility-Administered Medications  Medication Dose Route Frequency Provider Last Rate Last Dose  . acetaminophen (TYLENOL) tablet 650 mg  650 mg Oral Q6H PRN Charm Rings, NP   650 mg at 11/08/14 0954  . alum & mag hydroxide-simeth (MAALOX/MYLANTA) 200-200-20 MG/5ML suspension 30 mL  30 mL Oral PRN Melina Schools  M Hess, PA-C      . ARIPiprazole (ABILIFY) tablet 5 mg  5 mg Oral BID Myrlene Brokereborah R Ross, MD   5 mg at 11/08/14 0954  . ciprofloxacin (CIPRO) tablet 500 mg  500 mg Oral BID Nada BoozerRobyn M Hess, PA-C   500 mg at 11/08/14 [id number]  . divalproex (DEPAKOTE ER) 24 hr tablet 500 mg  500 mg Oral QHS Myrlene Brokereborah R Ross, MD   500 mg at 11/07/14 2056  . hydrOXYzine (ATARAX/VISTARIL) tablet 25 mg  25 mg Oral Q6H PRN Charm RingsJamison Y Lord, NP   25 mg at 11/08/14 0034  . nicotine (NICODERM CQ - dosed in mg/24 hours) patch 21 mg  21 mg Transdermal Daily Kathrynn SpeedRobyn M Hess, PA-C   21 mg at 11/08/14 0954  . ondansetron (ZOFRAN) tablet 4 mg  4 mg Oral Q8H PRN Robyn M Hess, PA-C      . zolpidem (AMBIEN) tablet 5  mg  5 mg Oral QHS PRN Kathrynn Speedobyn M Hess, PA-C       Current Outpatient Prescriptions  Medication Sig Dispense Refill  . amphetamine-dextroamphetamine (ADDERALL) 30 MG tablet Take 30 mg by mouth 2 (two) times daily.    . clonazePAM (KLONOPIN) 1 MG tablet Take 1 mg by mouth 2 (two) times daily.  2    Musculoskeletal: Strength & Muscle Tone: within normal limits Gait & Station: normal Patient leans: N/A  Psychiatric Specialty Exam:     Blood pressure 142/68, pulse 74, temperature 99.2 F (37.3 C), temperature source Oral, resp. rate 18, SpO2 100 %.There is no weight on file to calculate BMI.  General Appearance: Casual  Eye Contact::  Good  Speech:  Normal Rate  Volume:  Normal  Mood:  Depressed, mild  Affect:  Congruent  Thought Process:  Coherent  Orientation:  Full (Time, Place, and Person)  Thought Content:  WDL  Suicidal Thoughts:  No  Homicidal Thoughts:  No  Memory:  Immediate;   Good Recent;   Good Remote;   Good  Judgement:  Fair  Insight:  Fair  Psychomotor Activity:  Normal  Concentration:  Good  Recall:  Good  Fund of Knowledge:Good  Language: Good  Akathisia:  No  Handed:  Right  AIMS (if indicated):     Assets:  Health and safety inspectorinancial Resources/Insurance Housing Leisure Time Physical Health Resilience Social Support  ADL's:  Intact  Cognition: WNL  Sleep:      Medical Decision Making: Review of Psycho-Social Stressors (1), Review or order clinical lab tests (1) and Review of Medication Regimen & Side Effects (2)  Treatment Plan Summary: Daily contact with patient to assess and evaluate symptoms and progress in treatment, Medication management and Plan discharge home with follow-up resources and Rx  Plan:  No evidence of imminent risk to self or others at present.   Disposition:  Discharge home with follow-up resources and Rx  Nanine MeansLORD, JAMISON, PMH-NP 11/08/2014 2:30 PM  Patient seen and I agree with assessment and plan  Diannia RuderDeborah Ross M.D.

## 2014-11-08 NOTE — ED Notes (Signed)
Up on the phone 

## 2014-11-08 NOTE — ED Notes (Addendum)
Written dc instructions and rx reviewed w/ pt.  Pt encouraged to follow up w/ OP services as directed and take her medications as prescribed.  Pt also encouraged to return/seek treatment for suicidal or homicidal thoughts or urges.  Pt verbalized understanding and denies si/hi/avh on dc.  Pt ambulatory w/o difficulty to dc window w/ mHt.  Belongings returned after leaving the area.

## 2014-11-08 NOTE — ED Notes (Signed)
Dr Tenny Crawoss and Catha NottinghamJamison NP into see.  Pt c/o rt elbow pain x1 month

## 2014-11-08 NOTE — [facility] Suicide Risk Assessment (Signed)
Suicide Risk Assessment  Discharge Assessment   [facility]   Demographic Factors:  Caucasian  Total Time spent with patient: 30 minutes  Musculoskeletal: Strength & Muscle Tone: within normal limits Gait & Station: normal Patient leans: N/A  Psychiatric Specialty Exam:     Blood pressure 142/68, pulse 74, temperature 99.2 F (37.3 C), temperature source Oral, resp. rate 18, SpO2 100 %.There is no weight on file to calculate BMI.  General Appearance: Casual  Eye Contact::  Good  Speech:  Normal Rate  Volume:  Normal  Mood:  Depressed, mild  Affect:  Congruent  Thought Process:  Coherent  Orientation:  Full (Time, Place, and Person)  Thought Content:  WDL  Suicidal Thoughts:  No  Homicidal Thoughts:  No  Memory:  Immediate;   Good Recent;   Good Remote;   Good  Judgement:  Fair  Insight:  Fair  Psychomotor Activity:  Normal  Concentration:  Good  Recall:  Good  Fund of Knowledge:Good  Language: Good  Akathisia:  No  Handed:  Right  AIMS (if indicated):     Assets:  Health and safety inspectorinancial Resources/Insurance Housing Leisure Time Physical Health Resilience Social Support  ADL's:  Intact  Cognition: WNL  Sleep:        Has this patient used any form of tobacco in the last 30 days? (Cigarettes, Smokeless Tobacco, Cigars, and/or Pipes) Yes, A prescription for an FDA-approved tobacco cessation medication was offered at discharge and the patient refused  Mental Status Per Nursing Assessment::   On Admission:   Suicidal ideations  Current Mental Status by Physician: NA  Loss Factors: NA  Historical Factors: NA  Risk Reduction Factors:   Responsible for children under 45 years of age, Sense of responsibility to family, Living with another person, especially a relative and Positive social support  Continued Clinical Symptoms:  Depression, mild  Cognitive Features That Contribute To Risk:  None    Suicide Risk:  Minimal: No identifiable  suicidal ideation.  Patients presenting with no risk factors but with morbid ruminations; may be classified as minimal risk based on the severity of the depressive symptoms  Principal Problem: Bipolar affective disorder, depressed, mild Discharge Diagnoses:  Patient Active Problem List   Diagnosis Date Noted  . Bipolar affective disorder, depressed, mild [F31.31] 11/08/2014    Priority: High  . Polysubstance abuse [F19.10] 11/08/2014    Priority: High  . Suicidal ideations [R45.851] 11/08/2014    Priority: High  . Rhabdomyolysis [M62.82] 12/07/2011  . Accidental poisoning by unspecified carbon monoxide [T58.91XA] 12/07/2011  . UTI (lower urinary tract infection) [N39.0] 12/07/2011      Plan Of Care/Follow-up recommendations:  Activity:  as tolerated Diet:  heart healthy diet  Is patient on multiple antipsychotic therapies at discharge:  No   Has Patient had three or more failed trials of antipsychotic monotherapy by history:  No  Recommended Plan for Multiple Antipsychotic Therapies: NA    Carolyn Jennings, PMH-NP 11/08/2014, 2:32 PM

## 2014-11-08 NOTE — ED Notes (Signed)
Snack given.

## 2015-01-26 ENCOUNTER — Telehealth: Payer: Self-pay | Admitting: Obstetrics and Gynecology

## 2015-01-26 DIAGNOSIS — R1031 Right lower quadrant pain: Secondary | ICD-10-CM

## 2015-01-26 NOTE — Telephone Encounter (Addendum)
Patient is having bloating and pain in her ovaries. Previous patient of Dr Farrel GobbleLathrop. Chart to triage

## 2015-01-26 NOTE — Telephone Encounter (Signed)
Spoke with patient. Patient was last seen on 03/03/2013 with Dr.Lathrop for IUD removal. Patient calling stating she has been experiencing right sided pain at a 8/10 for one month. Feels bloated and is getting full more easily with meals. Patient is concerned about having an ovarian mass. Patient has PUS on 12/17/2012 with Dr.Lathrop for follow up of 6cm cyst on her left ovary. Patient states pain comes and goes. Is not currently experiencing any pain but did earlier today. LMP was 01/05/2015. Advised will need to speak with provider regarding further recommendations regarding symptoms and appointment. Patient is agreeable and verbalizes understanding.

## 2015-01-26 NOTE — Telephone Encounter (Signed)
Office visit first please.  H/O 5.7cm cyst 2013 which resolved with follow up imaging.  IUD is no longer present.

## 2015-01-27 ENCOUNTER — Telehealth: Payer: Self-pay | Admitting: Obstetrics & Gynecology

## 2015-01-27 NOTE — Telephone Encounter (Signed)
Spoke with patient. Advised of message as seen below from Dr.Miller. Offered appointment tomorrow with Dr.Miller as ultrasound may be needed. Patient states she will need to speak to someone regarding transportation and return my call.

## 2015-01-27 NOTE — Telephone Encounter (Signed)
Spoke with patient. Appointment scheduled for tomorrow 6/2 at 1pm with Dr.Miller with 1:30pm ultrasound if needed. Patient is agreeable. Order for PUS placed for precert if needed.  Routing to provider for final review. Patient agreeable to disposition. Will close encounter.   Cc: Cathrine MusterSabrina Franklin  Patient aware provider will review message and nurse will return call if any additional advice or change of disposition.

## 2015-01-27 NOTE — Telephone Encounter (Signed)
Call to patient. Advised of benefit quote received for possible PUS. °Patient agreeable. °

## 2015-01-27 NOTE — Telephone Encounter (Signed)
Call to patient. Advised of benefit quote received for possible PUS. Patient agreeable.

## 2015-01-27 NOTE — Telephone Encounter (Signed)
Patient returning call and is also requesting insurance benefits.

## 2015-01-28 ENCOUNTER — Telehealth: Payer: Self-pay | Admitting: Obstetrics & Gynecology

## 2015-01-28 ENCOUNTER — Other Ambulatory Visit: Payer: Commercial Managed Care - PPO | Admitting: Obstetrics & Gynecology

## 2015-01-28 ENCOUNTER — Other Ambulatory Visit: Payer: Commercial Managed Care - PPO

## 2015-01-28 NOTE — Telephone Encounter (Signed)
Pt left voicemail at [phone] on 01/28/15 to cancel 1300 appointment.

## 2015-01-28 NOTE — Telephone Encounter (Signed)
Message left to return call to Delphina Schum at [phone].    

## 2015-02-01 NOTE — Telephone Encounter (Signed)
Spoke with patient. Patient would like to reschedule possible PUS for intermittent right sided pain appointment with Dr.Miller for next Thursday 6/16. Appointment rescheduled for 6/16 at 2:30pm for OV with Dr.Miller and 3pm ultrasound if needed. Patient is agreeable to date and time.  Routing to provider for final review. Patient agreeable to disposition. Will close encounter.

## 2015-02-01 NOTE — Telephone Encounter (Signed)
Patient states she wants to reschedule ultrasound appointment. Patient ok for call back

## 2015-02-11 ENCOUNTER — Ambulatory Visit: Payer: Medicare Other

## 2015-02-11 ENCOUNTER — Ambulatory Visit (INDEPENDENT_AMBULATORY_CARE_PROVIDER_SITE_OTHER): Payer: Medicare Other | Admitting: Obstetrics & Gynecology

## 2015-02-11 VITALS — BP 124/78 | HR 64 | Resp 16 | Wt 130.8 lb

## 2015-02-11 DIAGNOSIS — R1031 Right lower quadrant pain: Secondary | ICD-10-CM | POA: Diagnosis not present

## 2015-02-11 DIAGNOSIS — K5909 Other constipation: Secondary | ICD-10-CM

## 2015-02-11 NOTE — Progress Notes (Signed)
Subjective:     Patient ID: Carolyn Jennings, female   DOB: 19-Oct-1970, 44 y.o.   MRN: [id number]  HPI 44 yo G2P2 MWF here for discussion of intermittent RLQ quadrant that has been off and on for months.  Primarily the pain is a dull achy pain that is present all of the time.  Occasionally she will have sharp, shooting pains that will make her double over.  However, in last several weeks, this seems to have completely resolved.  Biopolar treatment medications were switched to Depakote and Rxulti and within a few days the pain resolved.  Has not started back.  As she is describing this to me, she reports she did not realize there was a connection but now really feels there is.  She states "these medicines must have worked the crazies out in my head and my abdomen".  Discussed with pt it is possible she was having IBS related symptoms but with better treatment of psychological diagnoses, this has improved or could have been a side effect of prior medications.  Pt reports she usually has a normal bowel movement once a day but can have issues with constipation.  She eats well--greens, fiber, and a good amount of water.  This seems to be better with current medications as well.    Had Mirena IUD from 10/01/12 to 03/03/13.  Reports she had a pinching pain that was present all the time while the IUD was in place.  She couldn't bend over without worsening cramping/pain when the IUD was in place.  This was removed 03/03/13 and those symptoms all completely resolved.    Reports her cycles are typically regular.  Flow lasts 4 days.  None are heavy.  Denies pain with intercourse.  No recent UTI.    Review of Systems  All other systems reviewed and are negative.     Objective:   Physical Exam  Constitutional: She appears well-developed and well-nourished.  Abdominal: Soft. Bowel sounds are normal. She exhibits no distension. There is no tenderness. There is no rebound and no guarding.  Genitourinary: Vagina normal  and uterus normal. There is no rash, tenderness or lesion on the right labia. There is no rash, tenderness or lesion on the left labia. Uterus is not fixed and not tender. Cervix exhibits no motion tenderness. Right adnexum displays no mass, no tenderness and no fullness. Left adnexum displays no mass, no tenderness and no fullness.  Pt thin and exam is very easy.  Lymphadenopathy:       Right: No inguinal adenopathy present.       Left: No inguinal adenopathy present.  Skin: Skin is warm and dry.  Psychiatric: She has a normal mood and affect.       Assessment:     RLQ pain, resolved with change in bipolar medication in last couple of months Completely normal exam today  Occasional constipation    Plan:     Pt was scheduled for PUS today but I do not think this is necessary given her completely normal exam today and actual resolution of symptoms Constipation treatment options discussed with pt.

## 2015-02-19 ENCOUNTER — Encounter: Payer: Self-pay | Admitting: Obstetrics & Gynecology

## 2015-03-22 ENCOUNTER — Encounter (HOSPITAL_COMMUNITY): Payer: Self-pay | Admitting: Family Medicine

## 2015-03-22 ENCOUNTER — Emergency Department (HOSPITAL_COMMUNITY)
Admission: EM | Admit: 2015-03-22 | Discharge: 2015-03-22 | Disposition: A | Discharge status: Other Acute Inpatient Hospital | Payer: 59 | Source: Home / Self Care | Attending: Emergency Medicine | Admitting: Emergency Medicine

## 2015-03-22 ENCOUNTER — Inpatient Hospital Stay (HOSPITAL_COMMUNITY)
Admission: AD | Admit: 2015-03-22 | Discharge: 2015-03-29 | DRG: 885 | Disposition: A | Discharge status: Home / Self Care | Payer: 59 | Source: Intra-hospital | Attending: Psychiatry | Admitting: Psychiatry

## 2015-03-22 ENCOUNTER — Encounter (HOSPITAL_COMMUNITY): Payer: Self-pay

## 2015-03-22 DIAGNOSIS — F22 Delusional disorders: Secondary | ICD-10-CM | POA: Diagnosis present

## 2015-03-22 DIAGNOSIS — R110 Nausea: Secondary | ICD-10-CM

## 2015-03-22 DIAGNOSIS — F419 Anxiety disorder, unspecified: Secondary | ICD-10-CM | POA: Insufficient documentation

## 2015-03-22 DIAGNOSIS — Z880 Allergy status to penicillin: Secondary | ICD-10-CM | POA: Insufficient documentation

## 2015-03-22 DIAGNOSIS — G4730 Sleep apnea, unspecified: Secondary | ICD-10-CM | POA: Insufficient documentation

## 2015-03-22 DIAGNOSIS — Z79899 Other long term (current) drug therapy: Secondary | ICD-10-CM

## 2015-03-22 DIAGNOSIS — F900 Attention-deficit hyperactivity disorder, predominantly inattentive type: Secondary | ICD-10-CM | POA: Insufficient documentation

## 2015-03-22 DIAGNOSIS — F17210 Nicotine dependence, cigarettes, uncomplicated: Secondary | ICD-10-CM | POA: Diagnosis present

## 2015-03-22 DIAGNOSIS — F310 Bipolar disorder, current episode hypomanic: Secondary | ICD-10-CM | POA: Diagnosis present

## 2015-03-22 DIAGNOSIS — F1010 Alcohol abuse, uncomplicated: Secondary | ICD-10-CM | POA: Diagnosis present

## 2015-03-22 DIAGNOSIS — F909 Attention-deficit hyperactivity disorder, unspecified type: Secondary | ICD-10-CM | POA: Diagnosis present

## 2015-03-22 DIAGNOSIS — F1310 Sedative, hypnotic or anxiolytic abuse, uncomplicated: Secondary | ICD-10-CM

## 2015-03-22 DIAGNOSIS — Z8679 Personal history of other diseases of the circulatory system: Secondary | ICD-10-CM | POA: Insufficient documentation

## 2015-03-22 DIAGNOSIS — F319 Bipolar disorder, unspecified: Principal | ICD-10-CM | POA: Diagnosis present

## 2015-03-22 DIAGNOSIS — Z720 Tobacco use: Secondary | ICD-10-CM

## 2015-03-22 DIAGNOSIS — R45851 Suicidal ideations: Secondary | ICD-10-CM

## 2015-03-22 HISTORY — DX: Other specified behavioral and emotional disorders with onset usually occurring in childhood and adolescence: F98.8

## 2015-03-22 LAB — CBC
HEMATOCRIT: 44.3 % (ref 36.0–46.0)
Hemoglobin: 15.4 g/dL — ABNORMAL HIGH (ref 12.0–15.0)
MCH: 32.6 pg (ref 26.0–34.0)
MCHC: 34.8 g/dL (ref 30.0–36.0)
MCV: 93.9 fL (ref 78.0–100.0)
PLATELETS: 261 10*3/uL (ref 150–400)
RBC: 4.72 MIL/uL (ref 3.87–5.11)
RDW: 13.2 % (ref 11.5–15.5)
WBC: 11 10*3/uL — AB (ref 4.0–10.5)

## 2015-03-22 LAB — COMPREHENSIVE METABOLIC PANEL
ALT: 12 U/L — ABNORMAL LOW (ref 14–54)
ANION GAP: 12 (ref 5–15)
AST: 14 U/L — ABNORMAL LOW (ref 15–41)
Albumin: 4.7 g/dL (ref 3.5–5.0)
Alkaline Phosphatase: 46 U/L (ref 38–126)
BUN: 7 mg/dL (ref 6–20)
CHLORIDE: 107 mmol/L (ref 101–111)
CO2: 20 mmol/L — ABNORMAL LOW (ref 22–32)
Calcium: 9.4 mg/dL (ref 8.9–10.3)
Creatinine, Ser: 0.9 mg/dL (ref 0.44–1.00)
Glucose, Bld: 98 mg/dL (ref 65–99)
Potassium: 3.7 mmol/L (ref 3.5–5.1)
Sodium: 139 mmol/L (ref 135–145)
TOTAL PROTEIN: 7 g/dL (ref 6.5–8.1)
Total Bilirubin: 0.9 mg/dL (ref 0.3–1.2)

## 2015-03-22 LAB — RAPID URINE DRUG SCREEN, HOSP PERFORMED
Amphetamines: NOT DETECTED
BARBITURATES: NOT DETECTED
BENZODIAZEPINES: POSITIVE — AB
Cocaine: NOT DETECTED
Opiates: NOT DETECTED
Tetrahydrocannabinol: NOT DETECTED

## 2015-03-22 LAB — ACETAMINOPHEN LEVEL

## 2015-03-22 LAB — ETHANOL: Alcohol, Ethyl (B): <5 mg/dL (ref <5)

## 2015-03-22 LAB — SALICYLATE LEVEL: Salicylate Lvl: <4 mg/dL (ref 2.8–30.0)

## 2015-03-22 MED ORDER — LORAZEPAM 1 MG PO TABS
1.0000 mg | ORAL_TABLET | Freq: Three times a day (TID) | ORAL | Status: DC | PRN
  Administered 2015-03-22: 1 mg via ORAL
  Filled 2015-03-22: qty 1

## 2015-03-22 MED ORDER — LORAZEPAM 2 MG/ML IJ SOLN: 1.0000 mg | Freq: Once | INTRAMUSCULAR | Status: AC

## 2015-03-22 MED ORDER — DIVALPROEX SODIUM ER 500 MG PO TB24
500.0000 mg | ORAL_TABLET | Freq: Every day | ORAL | Status: DC
  Filled 2015-03-22: qty 1
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
## 2015-03-22 NOTE — ED Notes (Signed)
Dinner order placed for pt

## 2015-03-22 NOTE — Progress Notes (Signed)
Patient accepted at [facility], to Dr. Dub Mikes, bed 307-1, patient can come any time now.  Call report at [phone]. RN Danielle informed.  Melbourne Abts, LCSWA Disposition staff 03/22/2015 7:56 PM

## 2015-03-22 NOTE — ED Notes (Addendum)
Pt placed in burgundy scrubs, security called to wand and called for sitter.

## 2015-03-22 NOTE — ED Notes (Signed)
PT ON PHONE AT NURSES' DESK. RN ADVISED PT OF POD C POLICIES - VOICED UNDERSTANDING.

## 2015-03-22 NOTE — ED Provider Notes (Signed)
CSN: [id number]     Arrival date & time 03/22/15  1522 History   First MD Initiated Contact with Patient 03/22/15 1634     Chief Complaint  Patient presents with  . Suicidal     HPI  Patient presents with concern of anxiety, suicidal ideation, paranoia. Patient is a long history of psychiatric disease, states that about 3 months ago, she had changes in medication. Since that time she's noticed increased anxiety, sleeplessness, and intermittent suicidal ideation. Patient has been receiving treatment for alcohol dependency as well. Today the patient was at her treatment Center, sent here for evaluation after she acknowledged suicidal thoughts. Currently she denies abdominal or any other pain.     Past Medical History  Diagnosis Date  . Bipolar 1 disorder   . Anxiety   . Depression   . ASCUS with positive high risk HPV 2007  . Migraines    History reviewed. No pertinent past surgical history. Family History  Problem Relation Age of Onset  . Heart disease Father   . Hypertension Father   . Heart attack Father   . Diabetes Mother     Type 1   History  Substance Use Topics  . Smoking status: Current Every Day Smoker -- 1.50 packs/day for 15 years    Types: Cigarettes  . Smokeless tobacco: Not on file  . Alcohol Use: 1.1 oz/week    1 Glasses of wine, 1 Standard drinks or equivalent per week     Comment: every week   OB History    Gravida Para Term Preterm AB TAB SAB Ectopic Multiple Living   2 2 2       2      Review of Systems  Constitutional:       Per HPI, otherwise negative  HENT:       Per HPI, otherwise negative  Respiratory:       Per HPI, otherwise negative  Cardiovascular:       Per HPI, otherwise negative  Gastrointestinal: Positive for nausea. Negative for vomiting.  Endocrine:       Negative aside from HPI  Genitourinary:       Neg aside from HPI   Musculoskeletal:       Per HPI, otherwise negative  Skin: Negative.   Neurological: Negative for  syncope.  Psychiatric/Behavioral: Positive for suicidal ideas, hallucinations, sleep disturbance, dysphoric mood and decreased concentration. The patient is nervous/anxious and is hyperactive.       Allergies  Uribel; Metronidazole; Penicillins; Progesterone; Ampicillin; and Diflucan  Home Medications   Prior to Admission medications   Medication Sig Start Date End Date Taking? Authorizing Provider  divalproex (DEPAKOTE ER) 500 MG 24 hr tablet Take 1 tablet (500 mg total) by mouth at bedtime. 11/08/14   Charm Rings, NP  REXULTI 3 MG TABS  02/09/15   Historical Provider, MD   BP 158/99 mmHg  Pulse 111  Temp(Src) 98.5 F (36.9 C) (Oral)  Resp 20  SpO2 96% Physical Exam  Constitutional: She is oriented to person, place, and time. She appears well-developed and well-nourished. No distress.  HENT:  Head: Normocephalic and atraumatic.  Eyes: Conjunctivae and EOM are normal.  Cardiovascular: Normal rate and regular rhythm.   Pulmonary/Chest: Effort normal and breath sounds normal. No stridor. No respiratory distress.  Abdominal: She exhibits no distension. There is no hepatosplenomegaly. There is no tenderness. There is no rigidity and no guarding.  Musculoskeletal: She exhibits no edema.  Neurological: She is alert and  oriented to person, place, and time. No cranial nerve deficit.  Skin: Skin is warm and dry.  Psychiatric: Her mood appears anxious. Her speech is rapid and/or pressured. She expresses suicidal ideation. She expresses no suicidal plans.  Nursing note and vitals reviewed.   ED Course  Procedures (including critical care time) Labs Review Labs Reviewed  COMPREHENSIVE METABOLIC PANEL - Abnormal; Notable for the following:    CO2 20 (*)    AST 14 (*)    ALT 12 (*)    All other components within normal limits  ACETAMINOPHEN LEVEL - Abnormal; Notable for the following:    Acetaminophen (Tylenol), Serum <10 (*)    All other components within normal limits  CBC -  Abnormal; Notable for the following:    WBC 11.0 (*)    Hemoglobin 15.4 (*)    All other components within normal limits  URINE RAPID DRUG SCREEN, HOSP PERFORMED - Abnormal; Notable for the following:    Benzodiazepines POSITIVE (*)    All other components within normal limits  ETHANOL  SALICYLATE LEVEL    Pulse ox 100% room air normal   MDM  Patient presents with concern of sleeplessness, anxiety, suicidal ideation per Patient has multiple psychiatric issues and given her description of substantial life stress, she is at elevated risk for suicidal behavior. Patient medically cleared for psychiatric evaluation.   Gerhard Munch, MD 03/22/15 1740

## 2015-03-22 NOTE — ED Notes (Signed)
Pt here for SI thoughts. sts she hasn't been on her meds for bipolar and anxiety. sts also detox from drinking. sts she drank Thursday and Friday.

## 2015-03-22 NOTE — ED Notes (Signed)
TTS in progress 

## 2015-03-23 ENCOUNTER — Encounter (HOSPITAL_COMMUNITY): Payer: Self-pay | Admitting: Psychiatry

## 2015-03-23 DIAGNOSIS — F310 Bipolar disorder, current episode hypomanic: Secondary | ICD-10-CM | POA: Diagnosis present

## 2015-03-23 DIAGNOSIS — F319 Bipolar disorder, unspecified: Principal | ICD-10-CM

## 2015-03-23 DIAGNOSIS — F22 Delusional disorders: Secondary | ICD-10-CM

## 2015-03-23 DIAGNOSIS — F1010 Alcohol abuse, uncomplicated: Secondary | ICD-10-CM

## 2015-03-23 DIAGNOSIS — F909 Attention-deficit hyperactivity disorder, unspecified type: Secondary | ICD-10-CM | POA: Diagnosis present

## 2015-03-23 MED ORDER — MAGNESIUM HYDROXIDE 400 MG/5ML PO SUSP: 30.0000 mL | Freq: Every day | ORAL | Status: DC | PRN

## 2015-03-23 MED ORDER — ALUM & MAG HYDROXIDE-SIMETH 200-200-20 MG/5ML PO SUSP: 30.0000 mL | ORAL | Status: DC | PRN

## 2015-03-23 MED ORDER — LORAZEPAM 1 MG PO TABS
1.0000 mg | ORAL_TABLET | Freq: Three times a day (TID) | ORAL | Status: AC
  Administered 2015-03-25 (×3): 1 mg via ORAL
  Filled 2015-03-23 (×3): qty 1

## 2015-03-23 MED ORDER — LORAZEPAM 1 MG PO TABS
1.0000 mg | ORAL_TABLET | Freq: Four times a day (QID) | ORAL | Status: AC
  Administered 2015-03-23 – 2015-03-24 (×6): 1 mg via ORAL
  Filled 2015-03-23 (×6): qty 1

## 2015-03-23 MED ORDER — LORAZEPAM 1 MG PO TABS
1.0000 mg | ORAL_TABLET | Freq: Every day | ORAL | Status: AC
  Administered 2015-03-27: 1 mg via ORAL
  Filled 2015-03-23: qty 1

## 2015-03-23 MED ORDER — LORAZEPAM 1 MG PO TABS
1.0000 mg | ORAL_TABLET | Freq: Two times a day (BID) | ORAL | Status: AC
  Administered 2015-03-26 (×2): 1 mg via ORAL
  Filled 2015-03-23 (×2): qty 1

## 2015-03-23 MED ORDER — DOXEPIN HCL 25 MG PO CAPS
25.0000 mg | ORAL_CAPSULE | Freq: Every evening | ORAL | Status: DC | PRN
  Administered 2015-03-23 – 2015-03-25 (×3): 25 mg via ORAL
  Filled 2015-03-23 (×3): qty 1

## 2015-03-23 MED ORDER — LOPERAMIDE HCL 2 MG PO CAPS: 2.0000 mg | ORAL_CAPSULE | ORAL | Status: AC | PRN

## 2015-03-23 MED ORDER — ACETAMINOPHEN 325 MG PO TABS: 650.0000 mg | ORAL_TABLET | Freq: Four times a day (QID) | ORAL | Status: DC | PRN

## 2015-03-23 MED ORDER — ONDANSETRON 4 MG PO TBDP: 4.0000 mg | ORAL_TABLET | Freq: Four times a day (QID) | ORAL | Status: AC | PRN

## 2015-03-23 MED ORDER — HYDROXYZINE HCL 25 MG PO TABS
25.0000 mg | ORAL_TABLET | Freq: Four times a day (QID) | ORAL | Status: AC | PRN
  Administered 2015-03-23 – 2015-03-25 (×3): 25 mg via ORAL
  Filled 2015-03-23 (×3): qty 1

## 2015-03-23 MED ORDER — LORAZEPAM 1 MG PO TABS: 1.0000 mg | ORAL_TABLET | Freq: Four times a day (QID) | ORAL | Status: DC | PRN

## 2015-03-23 MED ORDER — NICOTINE 21 MG/24HR TD PT24
21.0000 mg | MEDICATED_PATCH | Freq: Every day | TRANSDERMAL | Status: DC
  Administered 2015-03-23 – 2015-03-29 (×7): 21 mg via TRANSDERMAL
  Filled 2015-03-23 (×7): qty 1
  Filled 2015-03-23: qty 14
  Filled 2015-03-23: qty 1

## 2015-03-23 NOTE — Progress Notes (Signed)
Patient ID: Carolyn Jennings, female   DOB: 1970-10-03, 44 y.o.   MRN: [id number] D-First day on unit, after being admitted last pm. She is adjusting well, and is verbal and appropriate with peers. Coloring with two peers now in dayroom. She completed her self survey this am and scored self an 8 on anxiety and 5 on depression. She saw Dr late this afternoon and he started her on Ativan. She states lately her sleep has been poor and she has been more anxious. A-Support offered. Monitored for safety. medications as ordered. R-No complaints at this time. More isolative in the pm to her room.

## 2015-03-23 NOTE — [facility] Group Notes (Signed)
[facility] LCSW Group Therapy 03/23/2015 1:15 PM Type of Therapy: Group Therapy Participation Level: Active  Participation Quality: Attentive, Sharing and Supportive  Affect: Depressed and Flat  Cognitive: Alert and Oriented  Insight: Developing/Improving and Engaged  Engagement in Therapy: Developing/Improving and Engaged  Modes of Intervention: Activity, Clarification, Confrontation, Discussion, Education, Exploration, Limit-setting, Orientation, Problem-solving, Rapport Building, Reality Testing, Socialization and Support  Summary of Progress/Problems: Patient was attentive and engaged with speaker from [employer]. Patient was attentive to speaker while they shared their story of dealing with mental health and overcoming it. Patient expressed interest in their programs and services and received information on their agency. Patient processed ways they can relate to the speaker.   Jet Armbrust, MSW, LCSWA Clinical Social Worker [facility] [phone]   

## 2015-03-23 NOTE — Tx Team (Signed)
Initial Interdisciplinary Treatment Plan   PATIENT STRESSORS: Legal issue Medication change or noncompliance Substance abuse   PATIENT STRENGTHS: Active sense of humor Capable of independent living General fund of knowledge Supportive family/friends   PROBLEM LIST: Problem List/Patient Goals Date to be addressed Date deferred Reason deferred Estimated date of resolution  Risk for suicide 03/22/15     Medication non-compliance 03/22/15     DUI's 03/22/15     "help with panic attacks" 03/22/15                                    DISCHARGE CRITERIA:  Adequate post-discharge living arrangements Improved stabilization in mood, thinking, and/or behavior Verbal commitment to aftercare and medication compliance  PRELIMINARY DISCHARGE PLAN: Attend aftercare/continuing care group Outpatient therapy  PATIENT/FAMIILY INVOLVEMENT: This treatment plan has been presented to and reviewed with the patient, Carolyn Jennings.  The patient and family have been given the opportunity to ask questions and make suggestions.  [name] 03/23/2015, 4:31 AM

## 2015-03-23 NOTE — [facility] Suicide Risk Assessment (Signed)
[facility] Admission Suicide Risk Assessment   Nursing information obtained from:    Demographic factors:    Current Mental Status:    Loss Factors:    Historical Factors:    Risk Reduction Factors:    Total Time spent with patient: 30 minutes Principal Problem: Bipolar 1 disorder Diagnosis:   Patient Active Problem List   Diagnosis Date Noted  . Bipolar 1 disorder [F31.9] 03/23/2015  . Alcohol abuse [F10.10] 03/23/2015  . ADHD (attention deficit hyperactivity disorder) [F90.9] 03/23/2015  . Paranoia [F22] 03/23/2015  . Bipolar affective disorder, depressed, mild [F31.31] 11/08/2014  . Polysubstance abuse [F19.10] 11/08/2014  . Suicidal ideations [R45.851] 11/08/2014  . Fatty infiltration of liver [K76.0] 02/28/2013  . Macrocytosis without anemia [D75.89] 02/28/2013  . Elevation of level of transaminase or lactic acid dehydrogenase (LDH) [R74.0] 02/28/2013  . Rhabdomyolysis [M62.82] 12/07/2011  . Accidental poisoning by unspecified carbon monoxide(E868.9) [T58.91XA] 12/07/2011  . UTI (lower urinary tract infection) [N39.0] 12/07/2011     Continued Clinical Symptoms:  Alcohol Use Disorder Identification Test Final Score (AUDIT): 15 The "Alcohol Use Disorders Identification Test", Guidelines for Use in Primary Care, Second Edition.  World Science writer [facility]). Score between 0-7:  no or low risk or alcohol related problems. Score between 8-15:  moderate risk of alcohol related problems. Score between 16-19:  high risk of alcohol related problems. Score 20 or above:  warrants further diagnostic evaluation for alcohol dependence and treatment.   CLINICAL FACTORS:   Bipolar Disorder:   Depressive phase Alcohol/Substance Abuse/Dependencies  Psychiatric Specialty Exam: Physical Exam  ROS  Blood pressure 116/90, pulse 102, temperature 98.5 F (36.9 C), temperature source Oral, resp. rate 16, height  (1.549 m), weight 57.153 kg (126 lb), last menstrual period 03/19/2015.Body  mass index is 23.82 kg/(m^2).    COGNITIVE FEATURES THAT CONTRIBUTE TO RISK:  Closed-mindedness, Polarized thinking and Thought constriction (tunnel vision)    SUICIDE RISK:   Moderate:  Frequent suicidal ideation with limited intensity, and duration, some specificity in terms of plans, no associated intent, good self-control, limited dysphoria/symptomatology, some risk factors present, and identifiable protective factors, including available and accessible social support.  PLAN OF CARE:  Supportive approach/coping skills                               Alcohol abuse; identify detox needs/work a relapse prevention plan                               Mood instability; will reassess for mood stabilizers                              Paranoia; will work to improve reality testing                               CBT/mindfulness Medical Decision Making:  Review of Psycho-Social Stressors (1), Review or order clinical lab tests (1), Review of Medication Regimen & Side Effects (2) and Review of New Medication or Change in Dosage (2)  I certify that inpatient services furnished can reasonably be expected to improve the patient's condition.   Jameire Kouba A 03/23/2015, 4:29 PM
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
## 2015-03-23 NOTE — Progress Notes (Signed)
D: Pt denies SI/HI/AVH. Pt is pleasant and cooperative. Pt has no insight, pt tries to have some insight but pt continues to see things with tunnel vision. Pt appears to be very closed minded about her situation.   A: Pt was offered support and encouragement. Pt was given scheduled medications. Pt was encourage to attend groups. Q 15 minute checks were done for safety.   R:Pt attends groups and interacts well with peers and staff. Pt is taking medication. Pt has no complaints at this time .Pt receptive to treatment and safety maintained on unit.
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
## 2015-03-23 NOTE — Progress Notes (Signed)
Recreation Therapy Notes  Animal-Assisted Activity (AAA) Program Checklist/Progress Notes Patient Eligibility Criteria Checklist & Daily Group note for Rec Tx Intervention  Date: 07.26.16 Time: 2:45 pm Location: [address]  AAA/T Program Assumption of Risk Form signed by Patient/ or Parent Legal Guardian yes  Patient is free of allergies or sever asthma yes  Patient reports no fear of animals yes  Patient reports no history of cruelty to animalsyes  Patient understands his/her participation is voluntary yes  Patient washes hands before animal contact yes  Patient washes hands after animal contact yes  Education: Hand Washing, Appropriate Animal Interaction   Education Outcome: Acknowledges understanding/In group clarification offered/Needs additional education.   Clinical Observations/Feedback:  Patient did not attend group.   Carolyn Jennings, LRT/CTRS         Carolyn Jennings A 03/23/2015 4:12 PM

## 2015-03-23 NOTE — [facility] Group Notes (Signed)
[facility] Group Notes:  (Nursing/MHT/Case Management/Adjunct)  Date:  03/23/2015  Time:  3:07 PM  Type of Therapy:  Nurse Education  Participation Level:  None  Participation Quality:  Attentive  Affect:  Appropriate  Cognitive:  Alert  Insight:  Lacking  Engagement in Group:  Lacking  Modes of Intervention:  Discussion and Education  Summary of Progress/Problems: came in late and offered nothing, when asked why she was in the hospital she replied it was hard to explain, and no more was said  Wynona Luna 03/23/2015, 3:07 PM

## 2015-03-23 NOTE — Progress Notes (Signed)
Pt attended the evening AA speaker meeting. 
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
## 2015-03-23 NOTE — Tx Team (Signed)
Interdisciplinary Treatment Plan Update (Adult) Date: 03/23/2015    Time Reviewed: 9:30 AM  Progress in Treatment: Attending groups: Continuing to assess, patient new to milieu Participating in groups: Continuing to assess, patient new to milieu Taking medication as prescribed: Yes Tolerating medication: Yes Family/Significant other contact made: No, CSW assessing for appropriate contacts Patient understands diagnosis: Yes Discussing patient identified problems/goals with staff: Yes Medical problems stabilized or resolved: Yes Denies suicidal/homicidal ideation: Yes Issues/concerns per patient self-inventory: Yes Other:  New problem(s) identified: N/A  Discharge Plan or Barriers: 03/23/2015:  CSW continuing to assess, patient new to milieu.  Reason for Continuation of Hospitalization:  Depression Anxiety Medication Stabilization   Comments: N/A  Estimated length of stay: 3-5 days   Patient is a 44year old female admitted for being off her medications for the past 2 weeks- depokate and rexulti. Patient will benefit from crisis stabilization, medication evaluation, group therapy, and psycho education in addition to case management for discharge planning. Patient and CSW reviewed pt's identified goals and treatment plan. Pt verbalized understanding and agreed to treatment plan.     Review of initial/current patient goals per problem list:  1. Goal(s): Patient will participate in aftercare plan   Met: No   Target date: 3-5 days post admission   As evidenced by: Patient will participate within aftercare plan AEB aftercare provider and housing plan at discharge being identified.  7/26: Goal not met: CSW assessing for appropriate referrals for pt and will have follow up secured prior to d/c.   2. Goal (s): Patient will exhibit decreased depressive symptoms and suicidal ideations.   Met: No   Target date: 3-5 days post admission   As evidenced by: Patient will  utilize self rating of depression at 3 or below and demonstrate decreased signs of depression or be deemed stable for discharge by MD.  7/26: Goal not met: Pt presents with flat affect and depressed mood.  Pt admitted with depression rating of 10.  Pt to show decreased sign of depression and a rating of 3 or less before d/c.     3. Goal(s): Patient will demonstrate decreased signs and symptoms of anxiety.   Met: No   Target date: 3-5 days post admission   As evidenced by: Patient will utilize self rating of anxiety at 3 or below and demonstrated decreased signs of anxiety, or be deemed stable for discharge by MD  7/26: Goal not met: Pt presents with anxious mood and affect.  Pt admitted with anxiety rating of 10.  Pt to show decreased sign of anxiety and a rating of 3 or less before d/c.    4. Goal(s): Patient will demonstrate decreased signs of withdrawal due to substance abuse   Met: Yes   Target date: 3-5 days post admission   As evidenced by: Patient will produce a CIWA/COWS score of 0, have stable vitals signs, and no symptoms of withdrawal  7/26: Goal met: No withdrawal symptoms reported at this time per medical chart.   Attendees: Patient:    Family:    Physician: Dr. Parke Poisson; Dr. Sabra Heck 03/23/2015 9:30 AM  Nursing: Kerby Nora RN 03/23/2015 9:30 AM  Clinical Social Worker: Erasmo Downer Eugen Jeansonne,  [place] 03/23/2015 9:30 AM  Other: Peri Maris, LCSWA 03/23/2015 9:30 AM  Other: Lucinda Dell, Beverly Sessions Liaison 03/23/2015 9:30 AM  Other:  Lars Pinks, RN CM 03/23/2015 9:30 AM  Other: Ave Filter, NP 03/23/2015 9:30 AM  Other:       Scribe for Treatment Team:  Tilden Fossa, MSW, [place]

## 2015-03-23 NOTE — Plan of Care (Signed)
Problem: Ineffective individual coping Goal: LTG: Patient will report a decrease in negative feelings Outcome: Progressing Pt stated she was felling a little better today  Problem: Alteration in mood; excessive anxiety as evidenced by: Goal: LTG-Patient's behavior demonstrates decreased anxiety (Patient's behavior demonstrates anxiety and he/she is utilizing learned coping skills to deal with anxiety-producing situations)  Outcome: Progressing Pt observed on unit interacting with guest, then with peers on unit, not displaying or mentioning signs of anxiety

## 2015-03-24 DIAGNOSIS — F1310 Sedative, hypnotic or anxiolytic abuse, uncomplicated: Secondary | ICD-10-CM

## 2015-03-24 MED ORDER — LURASIDONE HCL 40 MG PO TABS
20.0000 mg | ORAL_TABLET | Freq: Every day | ORAL | Status: DC
  Administered 2015-03-25: 20 mg via ORAL
  Filled 2015-03-24 (×5): qty 1

## 2015-03-24 NOTE — B[facility] Group Notes (Signed)
B[facility] LCSW Group Therapy 03/24/2015  1:15 PM Type of Therapy: Group Therapy Participation Level: Active  Participation Quality: Attentive, Sharing and Supportive  Affect: Appropriate  Cognitive: Alert and Oriented  Insight: Developing/Improving and Engaged  Engagement in Therapy: Developing/Improving and Engaged  Modes of Intervention: Clarification, Confrontation, Discussion, Education, Exploration, Limit-setting, Orientation, Problem-solving, Rapport Building, Dance movement psychotherapist, Socialization and Support  Summary of Progress/Problems: The topic for group today was emotional regulation. This group focused on both positive and negative emotion identification and allowed group members to process ways to identify feelings, regulate negative emotions, and find healthy ways to manage internal/external emotions. Group members were asked to reflect on a time when their reaction to an emotion led to a negative outcome and explored how alternative responses using emotion regulation would have benefited them. Group members were also asked to discuss a time when emotion regulation was utilized when a negative emotion was experienced. Patient shared that she has a tendency to hide her emotions and put on a tough facade due to her troubled upbringing. She identified it as a protective coping skill that she continues to use. Patient did share that spending time with her dog is a healthy coping skill. CSW and other group members provided patient with emotional support and encouragement.   Samuella Bruin, MSW, [employer] Clinical Social Worker [facility] [phone]

## 2015-03-24 NOTE — Progress Notes (Signed)
[name] MD Progress Note  03/24/2015 7:17 PM Carolyn Jennings  MRN:  [id number] Subjective:  Carolyn Jennings continues to evidence the underlying paranoia. She is still wanting to be on a benzodiazepine and Adderall. The rational of why is not a good idea was explained. She has a hard time understanding why as says that she has never abused either of both Principal Problem: Bipolar 1 disorder Diagnosis:   Patient Active Problem List   Diagnosis Date Noted  . Bipolar 1 disorder [F31.9] 03/23/2015  . Alcohol abuse [F10.10] 03/23/2015  . ADHD (attention deficit hyperactivity disorder) [F90.9] 03/23/2015  . Paranoia [F22] 03/23/2015  . Bipolar affective disorder, depressed, mild [F31.31] 11/08/2014  . Polysubstance abuse [F19.10] 11/08/2014  . Suicidal ideations [R45.851] 11/08/2014  . Fatty infiltration of liver [K76.0] 02/28/2013  . Macrocytosis without anemia [D75.89] 02/28/2013  . Elevation of level of transaminase or lactic acid dehydrogenase (LDH) [R74.0] 02/28/2013  . Rhabdomyolysis [M62.82] 12/07/2011  . Accidental poisoning by unspecified carbon monoxide(E868.9) [T58.91XA] 12/07/2011  . UTI (lower urinary tract infection) [N39.0] 12/07/2011   Total Time spent with patient: 30 minutes   Past Medical History:  Past Medical History  Diagnosis Date  . Bipolar 1 disorder   . Anxiety   . Depression   . ASCUS with positive high risk HPV 2007  . Migraines   . ADD (attention deficit disorder)    History reviewed. No pertinent past surgical history. Family History:  Family History  Problem Relation Age of Onset  . Heart disease Father   . Hypertension Father   . Heart attack Father   . Diabetes Mother     Type 1   Social History:  History  Alcohol Use  . 1.1 oz/week  . 1 Glasses of wine, 1 Standard drinks or equivalent per week    Comment: every week     History  Drug Use No    History   Social History  . Marital Status: Married    Spouse Name: N/A  . Number of Children:  N/A  . Years of Education: N/A   Social History Main Topics  . Smoking status: Current Every Day Smoker -- 1.50 packs/day for 15 years    Types: Cigarettes  . Smokeless tobacco: Not on file  . Alcohol Use: 1.1 oz/week    1 Glasses of wine, 1 Standard drinks or equivalent per week     Comment: every week  . Drug Use: No  . Sexual Activity: Yes    Birth Control/ Protection: None   Other Topics Concern  . None   Social History Narrative   Additional History:    Sleep: Fair  Appetite:  Fair   Assessment:   Musculoskeletal: Strength & Muscle Tone: within normal limits Gait & Station: normal Patient leans: normal   Psychiatric Specialty Exam: Physical Exam  Review of Systems  Constitutional: Negative.   HENT: Negative.   Eyes: Negative.   Respiratory: Negative.   Cardiovascular: Negative.   Gastrointestinal: Negative.   Genitourinary: Negative.   Musculoskeletal: Negative.   Skin: Negative.   Neurological: Negative.   Endo/Heme/Allergies: Negative.   Psychiatric/Behavioral: Positive for depression and substance abuse. The patient is nervous/anxious.     Blood pressure 103/76, pulse 97, temperature 98.3 F (36.8 C), temperature source Oral, resp. rate 16, height  (1.549 m), weight 57.153 kg (126 lb), last menstrual period 03/19/2015.Body mass index is 23.82 kg/(m^2).  General Appearance: Fairly Groomed  Patent attorney::  Fair  Speech:  Clear and Coherent  Volume:  fluctuates  Mood:  Anxious and at times euphoric but mostly suspicious  Affect:  anxious worried  Thought Process:  Coherent and Goal Directed  Orientation:  Full (Time, Place, and Person)  Thought Content:  symptoms events worries concerns  Suicidal Thoughts:  No  Homicidal Thoughts:  No  Memory:  Immediate;   Fair Recent;   Fair Remote;   Fair  Judgement:  Fair  Insight:  shallow  Psychomotor Activity:  Restlessness  Concentration:  Fair  Recall:  [employer] of Knowledge:Fair  Language:  Fair  Akathisia:  No  Handed:  Right  AIMS (if indicated):     Assets:  Desire for Improvement  ADL's:  Intact  Cognition: WNL  Sleep:  Number of Hours: 6.5     Current Medications: Current Facility-Administered Medications  Medication Dose Route Frequency Provider Last Rate Last Dose  . acetaminophen (TYLENOL) tablet 650 mg  650 mg Oral Q6H PRN Evanna Burkett, NP      . alum & mag hydroxide-simeth (MAALOX/MYLANTA) 200-200-20 MG/5ML suspension 30 mL  30 mL Oral Q4H PRN Evanna Burkett, NP      . doxepin (SINEQUAN) capsule 25 mg  25 mg Oral QHS PRN Evanna Burkett, NP   25 mg at 03/23/15 2242  . hydrOXYzine (ATARAX/VISTARIL) tablet 25 mg  25 mg Oral Q6H PRN Rachael Fee, MD   25 mg at 03/23/15 2241  . loperamide (IMODIUM) capsule 2-4 mg  2-4 mg Oral PRN Rachael Fee, MD      . LORazepam (ATIVAN) tablet 1 mg  1 mg Oral QID Rachael Fee, MD   1 mg at 03/24/15 1800   Followed by  . [START ON 03/25/2015] LORazepam (ATIVAN) tablet 1 mg  1 mg Oral TID Rachael Fee, MD       Followed by  . [START ON 03/26/2015] LORazepam (ATIVAN) tablet 1 mg  1 mg Oral BID Rachael Fee, MD       Followed by  . [START ON 03/27/2015] LORazepam (ATIVAN) tablet 1 mg  1 mg Oral Daily Rachael Fee, MD      . Melene Muller ON 03/25/2015] lurasidone (LATUDA) tablet 20 mg  20 mg Oral Q breakfast Rachael Fee, MD      . magnesium hydroxide (MILK OF MAGNESIA) suspension 30 mL  30 mL Oral Daily PRN Evanna Burkett, NP      . nicotine (NICODERM CQ - dosed in mg/24 hours) patch 21 mg  21 mg Transdermal Daily Rachael Fee, MD   21 mg at 03/24/15 0739  . ondansetron (ZOFRAN-ODT) disintegrating tablet 4 mg  4 mg Oral Q6H PRN Rachael Fee, MD        Lab Results: No results found for this or any previous visit (from the past 48 hour(s)).  Physical Findings: AIMS: Facial and Oral Movements Muscles of Facial Expression: None, normal Lips and Perioral Area: None, normal Jaw: None, normal Tongue: None, normal,Extremity  Movements Upper (arms, wrists, hands, fingers): None, normal Lower (legs, knees, ankles, toes): None, normal, Trunk Movements Neck, shoulders, hips: None, normal, Overall Severity Severity of abnormal movements (highest score from questions above): None, normal Incapacitation due to abnormal movements: None, normal Patient's awareness of abnormal movements (rate only patient's report): No Awareness, Dental Status Current problems with teeth and/or dentures?: No Does patient usually wear dentures?: No  CIWA:  CIWA-Ar Total: 0 COWS:  COWS Total Score: 1  Treatment Plan Summary: Daily contact with patient to  assess and evaluate symptoms and progress in treatment and Medication management Supportive approach/coping skills Benzodiazepine abuse; will pursue the Ativan detox protocol Mood instability; will have a trial with Latuda 20 mg in AM Paranoia; there is some chronicity to it, will work with the Latuda to see if it can make a dent Will work to improve her reality testing   Medical Decision Making:  Review of Psycho-Social Stressors (1), Review of Medication Regimen & Side Effects (2) and Review of New Medication or Change in Dosage (2)     Junious Ragone A 03/24/2015, 7:17 PM

## 2015-03-24 NOTE — Progress Notes (Signed)
D: Pt denies SI/HI/AV. Pt is pleasant and cooperative. Pt rates depression at a 8, anxiety at a 8, and Helplessness/hopelessness at a 8.  A: Pt was offered support and encouragement. Pt was given scheduled medications. Pt was encourage to attend groups. Q 15 minute checks were done for safety.  R:Pt attends some groups and interacts well with peers and staff. Pt taking medication. Pt has no complaints.Pt receptive to treatment and safety maintained on unit.

## 2015-03-24 NOTE — BHH Group Notes (Signed)
   [facility] LCSW Aftercare Discharge Planning Group Note  03/24/2015  8:45 AM   Participation Quality: Alert, Appropriate and Oriented  Mood/Affect: Appropriate  Depression Rating: 5  Anxiety Rating: 6  Thoughts of Suicide: Pt denies SI/HI  Will you contract for safety? Yes  Current AVH: Pt denies  Plan for Discharge/Comments: Pt attended discharge planning group and actively participated in group. CSW provided pt with today's workbook. Patient plans to return home to follow up with outpatient providers.  Transportation Means: Pt reports access to transportation  Supports: No supports mentioned at this time  Samuella Bruin, MSW, [employer] Clinical Social Worker [employer] [phone]

## 2015-03-24 NOTE — Progress Notes (Signed)
Recreation Therapy Notes  Date: 07.27.16 Time: 9:30 am Location: [address] Group Room  Group Topic: Stress Management  Goal Area(s) Addresses:  Patient will verbalize importance of using healthy stress management.  Patient will identify positive emotions associated with healthy stress management.   Intervention: Stress Management  Activity :  Guided Imagery Script.  LRT introduced the technique of guided imagery.  A script was used to deliver the technique to the patients.  Patients were asked to follow the script read a loud by LRT to engage in the technique of guided imagery.  Education:  Stress Management, Discharge Planning.   Education Outcome: Acknowledges edcuation/In group clarification offered/Needs additional education  Clinical Observations/Feedback: Patient did not attend group.   Leathia Farnell, LRT/CTRS         Yomaris Palecek A 03/24/2015 12:19 PM 

## 2015-03-24 NOTE — Progress Notes (Signed)
Pt attended the NA speaker meeting.

## 2015-03-24 NOTE — [facility] Suicide Risk Assessment (Signed)
[facility] INPATIENT:  Family/Significant Other Suicide Prevention Education  Suicide Prevention Education:  Patient Refusal for Family/Significant Other Suicide Prevention Education: The patient Carolyn Jennings has refused to provide written consent for family/significant other to be provided Family/Significant Other Suicide Prevention Education during admission and/or prior to discharge.  Physician notified. SPE reviewed with patient and brochure provided. Patient encouraged to return to hospital if having suicidal thoughts, patient verbalized his/her understanding and has no further questions at this time.   Ozelle Brubacher, [place] 03/24/2015, 11:22 AM

## 2015-03-25 NOTE — Progress Notes (Signed)
EKG performed by writer today. Results given to Altamease Oiler, RN., to report off to [place], MD. Hardcopy on pt chart.

## 2015-03-25 NOTE — Progress Notes (Signed)
D: Pt denies SI/HI/AVH. Pt is pleasant and cooperative. Pt seen interacting on the unit with peers. Pt forwards little information this evening. Pt stated she had real vivid dreams last night. Pt was told to let the Dr. Ashley Mariner and possibly she could try medication that can help reduce the nightmares. Pt continues to be labile at times, but can be redirected to getting under control.   A: Pt was offered support and encouragement. Pt was given scheduled medications. Pt was encourage to attend groups. Q 15 minute checks were done for safety.   R:Pt attends groups and interacts well with peers and staff. Pt is taking medication.t receptive to treatment and safety maintained on unit.

## 2015-03-25 NOTE — [facility] Group Notes (Signed)
[facility] Group Notes:  (Nursing/MHT/Case Management/Adjunct)  Date:  03/25/2015  Time:  10:02 AM  Type of Therapy:  Nurse Education  Participation Level:  None  Participation Quality:  none  Affect:  not present  Cognitive:  not present  Insight:  None  Engagement in Group:  no attendance  Modes of Intervention:  did not attendance  Summary of Progress/Problems:No attendance  Loren Racer 03/25/2015, 10:02 AM

## 2015-03-25 NOTE — Progress Notes (Signed)
[facility] MD Progress Note  03/25/2015 8:33 PM Carolyn Jennings  MRN:  [id number] Subjective:  Carolyn Jennings continues to worry about not having her Adderall and her Xanax. States she never abused them and that she needs the Adderall to function other wise she does not get anything accomplish.  Principal Problem: Bipolar 1 disorder Diagnosis:   Patient Active Problem List   Diagnosis Date Noted  . Bipolar 1 disorder [F31.9] 03/23/2015  . Alcohol abuse [F10.10] 03/23/2015  . ADHD (attention deficit hyperactivity disorder) [F90.9] 03/23/2015  . Paranoia [F22] 03/23/2015  . Bipolar affective disorder, depressed, mild [F31.31] 11/08/2014  . Polysubstance abuse [F19.10] 11/08/2014  . Suicidal ideations [R45.851] 11/08/2014  . Fatty infiltration of liver [K76.0] 02/28/2013  . Macrocytosis without anemia [D75.89] 02/28/2013  . Elevation of level of transaminase or lactic acid dehydrogenase (LDH) [R74.0] 02/28/2013  . Rhabdomyolysis [M62.82] 12/07/2011  . Accidental poisoning by unspecified carbon monoxide(E868.9) [T58.91XA] 12/07/2011  . UTI (lower urinary tract infection) [N39.0] 12/07/2011   Total Time spent with patient: 30 minutes   Past Medical History:  Past Medical History  Diagnosis Date  . Bipolar 1 disorder   . Anxiety   . Depression   . ASCUS with positive high risk HPV 2007  . Migraines   . ADD (attention deficit disorder)    History reviewed. No pertinent past surgical history. Family History:  Family History  Problem Relation Age of Onset  . Heart disease Father   . Hypertension Father   . Heart attack Father   . Diabetes Mother     Type 1   Social History:  History  Alcohol Use  . 1.1 oz/week  . 1 Glasses of wine, 1 Standard drinks or equivalent per week    Comment: every week     History  Drug Use No    History   Social History  . Marital Status: Married    Spouse Name: N/A  . Number of Children: N/A  . Years of Education: N/A   Social History Main  Topics  . Smoking status: Current Every Day Smoker -- 1.50 packs/day for 15 years    Types: Cigarettes  . Smokeless tobacco: Not on file  . Alcohol Use: 1.1 oz/week    1 Glasses of wine, 1 Standard drinks or equivalent per week     Comment: every week  . Drug Use: No  . Sexual Activity: Yes    Birth Control/ Protection: None   Other Topics Concern  . None   Social History Narrative   Additional History:    Sleep: Fair  Appetite:  Fair   Assessment:   Musculoskeletal: Strength & Muscle Tone: within normal limits Gait & Station: normal Patient leans: normal   Psychiatric Specialty Exam: Physical Exam  Review of Systems  Constitutional: Negative.   HENT: Negative.   Eyes: Negative.   Respiratory: Negative.   Cardiovascular: Negative.   Gastrointestinal: Negative.   Genitourinary: Negative.   Musculoskeletal: Negative.   Skin: Negative.   Neurological: Negative.   Endo/Heme/Allergies: Negative.   Psychiatric/Behavioral: Positive for depression and substance abuse. The patient is nervous/anxious.     Blood pressure 130/88, pulse 91, temperature 98 F (36.7 C), temperature source Oral, resp. rate 16, height  (1.549 m), weight 57.153 kg (126 lb), last menstrual period 03/19/2015.Body mass index is 23.82 kg/(m^2).  General Appearance: Fairly Groomed  Patent attorney::  Fair  Speech:  Clear and Coherent  Volume:  Normal  Mood:  Anxious and worried  Affect:  anxious worried  Thought Process:  Circumstantial, Coherent and Goal Directed  Orientation:  Full (Time, Place, and Person)  Thought Content:  symptoms events worries concerns  Suicidal Thoughts:  No  Homicidal Thoughts:  No  Memory:  Immediate;   Fair Recent;   Fair Remote;   Fair  Judgement:  Fair  Insight:  Shallow  Psychomotor Activity:  Restlessness  Concentration:  Fair  Recall:  [employer] of Knowledge:Fair  Language: Fair  Akathisia:  No  Handed:  Right  AIMS (if indicated):     Assets:   Desire for Improvement  ADL's:  Intact  Cognition: WNL  Sleep:  Number of Hours: 6.75     Current Medications: Current Facility-Administered Medications  Medication Dose Route Frequency Provider Last Rate Last Dose  . acetaminophen (TYLENOL) tablet 650 mg  650 mg Oral Q6H PRN Evanna Burkett, NP      . alum & mag hydroxide-simeth (MAALOX/MYLANTA) 200-200-20 MG/5ML suspension 30 mL  30 mL Oral Q4H PRN Evanna Burkett, NP      . doxepin (SINEQUAN) capsule 25 mg  25 mg Oral QHS PRN Kizzie Fantasia, NP   25 mg at 03/24/15 2203  . hydrOXYzine (ATARAX/VISTARIL) tablet 25 mg  25 mg Oral Q6H PRN Rachael Fee, MD   25 mg at 03/24/15 2203  . loperamide (IMODIUM) capsule 2-4 mg  2-4 mg Oral PRN Rachael Fee, MD      . Melene Muller ON 03/26/2015] LORazepam (ATIVAN) tablet 1 mg  1 mg Oral BID Rachael Fee, MD       Followed by  . [START ON 03/27/2015] LORazepam (ATIVAN) tablet 1 mg  1 mg Oral Daily Rachael Fee, MD      . lurasidone (LATUDA) tablet 20 mg  20 mg Oral Q breakfast Rachael Fee, MD   20 mg at 03/25/15 0803  . magnesium hydroxide (MILK OF MAGNESIA) suspension 30 mL  30 mL Oral Daily PRN Evanna Burkett, NP      . nicotine (NICODERM CQ - dosed in mg/24 hours) patch 21 mg  21 mg Transdermal Daily Rachael Fee, MD   21 mg at 03/25/15 0803  . ondansetron (ZOFRAN-ODT) disintegrating tablet 4 mg  4 mg Oral Q6H PRN Rachael Fee, MD        Lab Results: No results found for this or any previous visit (from the past 48 hour(s)).  Physical Findings: AIMS: Facial and Oral Movements Muscles of Facial Expression: None, normal Lips and Perioral Area: None, normal Jaw: None, normal Tongue: None, normal,Extremity Movements Upper (arms, wrists, hands, fingers): None, normal Lower (legs, knees, ankles, toes): None, normal, Trunk Movements Neck, shoulders, hips: None, normal, Overall Severity Severity of abnormal movements (highest score from questions above): None, normal Incapacitation due to abnormal  movements: None, normal Patient's awareness of abnormal movements (rate only patient's report): No Awareness, Dental Status Current problems with teeth and/or dentures?: No Does patient usually wear dentures?: No  CIWA:  CIWA-Ar Total: 0 COWS:  COWS Total Score: 1  Treatment Plan Summary: Daily contact with patient to assess and evaluate symptoms and progress in treatment and Medication management Supportive approach/coping skills Alcohol abuse/benzodiazepine dependence; continue Ativan detox  Work a relapse prevention plan Mood instability; continue to pursue the [place] Paranoia; continue to work to improve reality testing  Medical Decision Making:  Review of Psycho-Social Stressors (1) and Review of Medication Regimen & Side Effects (2)     Ved Martos A 03/25/2015, 8:33 PM

## 2015-03-25 NOTE — Plan of Care (Signed)
Problem: Diagnosis: Increased Risk For Suicide Attempt Goal: LTG-Patient Will Report Improved Mood and Deny Suicidal LTG (by discharge) Patient will report improved mood and deny suicidal ideation.  Outcome: Progressing Pt stated she felt better and denies SI at this time

## 2015-03-25 NOTE — Tx Team (Addendum)
Interdisciplinary Treatment Plan Update (Adult) Date: 03/25/2015    Time Reviewed: 9:30 AM  Progress in Treatment: Attending groups: Minimally Participating in groups: Minimally Taking medication as prescribed: Yes Tolerating medication: Yes Family/Significant other contact made: No, patient refused to provide consent Patient understands diagnosis: Yes Discussing patient identified problems/goals with staff: Yes Medical problems stabilized or resolved: Yes Denies suicidal/homicidal ideation: Yes Issues/concerns per patient self-inventory: Yes Other:  New problem(s) identified: N/A  Discharge Plan or Barriers: 03/23/15:  CSW continuing to assess, patient new to milieu.  Reason for Continuation of Hospitalization:  Depression Anxiety Medication Stabilization   Comments: N/A  Estimated length of stay: 3-5 days   Patient is a 44year old female admitted for being off her medications for the past 2 weeks- depokate and rexulti. Patient will benefit from crisis stabilization, medication evaluation, group therapy, and psycho education in addition to case management for discharge planning. Patient and CSW reviewed pt's identified goals and treatment plan. Pt verbalized understanding and agreed to treatment plan.     Review of initial/current patient goals per problem list:  1. Goal(s): Patient will participate in aftercare plan   Met: Yes   Target date: 3-5 days post admission   As evidenced by: Patient will participate within aftercare plan AEB aftercare provider and housing plan at discharge being identified.  7/26: Goal not met: CSW assessing for appropriate referrals for pt and will have follow up secured prior to d/c. 7/28:  Goal met, patient current in services w [facility], will return at discharge, also has appt at [facility] 9/16 8/1: Goal met: Patient plans to return home to follow up with outpatient services.  2. Goal (s): Patient will exhibit decreased  depressive symptoms and suicidal ideations.   Met: Yes   Target date: 3-5 days post admission   As evidenced by: Patient will utilize self rating of depression at 3 or below and demonstrate decreased signs of depression or be deemed stable for discharge by MD.  7/26: Goal not met: Pt presents with flat affect and depressed mood.  Pt admitted with depression rating of 10.  Pt to show decreased sign of depression and a rating of 3 or less before d/c.   7/28:  Goal progressing, patient rates depression at 7, presents w flat affect and depressed mood, denies SI, less irritable  8/1:Adequate for discharge per MD: Patient rates depression at 10 but denies SI and contracts for safety.   3. Goal(s): Patient will demonstrate decreased signs and symptoms of anxiety.   Met: Yes   Target date: 3-5 days post admission   As evidenced by: Patient will utilize self rating of anxiety at 3 or below and demonstrated decreased signs of anxiety, or be deemed stable for discharge by MD  7/26: Goal not met: Pt presents with anxious mood and affect.  Pt admitted with anxiety rating of 10.  Pt to show decreased sign of anxiety and a rating of 3 or less before d/c. 7/28:  Goal progressing, patient rates anxiety at 7/10.  8/1: Adequate for discharge per MD: Patient rates anxiety at 10 but reports feeling safe for discharge.   4. Goal(s): Patient will demonstrate decreased signs of withdrawal due to substance abuse   Met: Yes   Target date: 3-5 days post admission   As evidenced by: Patient will produce a CIWA/COWS score of 0, have stable vitals signs, and no symptoms of withdrawal  7/26: Goal met: No withdrawal symptoms reported at this time per medical chart.  Attendees: Patient:    Family:    Physician:  Dr. Sabra Heck 03/25/2015 9:30 AM  Nursing: Naida Sleight RN 03/25/2015 9:30 AM  Clinical Social Worker: Edwyna Shell,  LCSW 03/25/2015 9:30 AM  Other: Eduard Clos, LCSWA 03/25/2015  9:30 AM  Other: Lucinda Dell, [facility] Sessions Liaison 03/25/2015 9:30 AM  Other:  Lars Pinks, RN CM 03/25/2015 9:30 AM  Other: Ave Filter, NP 03/25/2015 9:30 AM  Other:       Scribe for Treatment Team:  Edwyna Shell, LCSW Clinical Social Worker

## 2015-03-25 NOTE — [facility] Group Notes (Signed)
[facility] LCSW Group Therapy  03/25/2015 2:47 PM  Type of Therapy:  Group Therapy  Participation Level:  Did Not Attend, invited and chose not to attend  Summary of Progress/Problems:  Finding Balance in Life. Today's group focused on defining balance in one's own words, identifying things that can knock one off balance, and exploring healthy ways to maintain balance in life. Group members were asked to provide an example of a time when they felt off balance, describe how they handled that situation, and process healthier ways to regain balance in the future. Group members were asked to share the most important tool for maintaining balance that they learned while at [facility] and how they plan to apply this method after discharge.   Conda Wannamaker C 03/25/2015, 2:47 PM  

## 2015-03-25 NOTE — Progress Notes (Signed)
D: Pt presents with flat affect and depressed mood. Pt c/o ongoing anxiety.  Anxiety 6/10. Pt rates depression 7/10.  Pt c/o ongoing intense nightmares for the last three nights. Pt denies suicidal thoughts. No withdrawal symptoms verbalized by pt. Pt less irritable today and more cooperative with staff. Pt compliant with taking meds and denies any side effect to meds. A: Medications administered as ordered per MD. Verbal support given. Pt encouraged to attend groups. 15 minute checks performed for safety.  R: Pt receptive to tx.

## 2015-03-26 MED ORDER — ARIPIPRAZOLE 2 MG PO TABS: 2.0000 mg | ORAL_TABLET | Freq: Every day | ORAL | Status: DC

## 2015-03-26 MED ORDER — LAMOTRIGINE 25 MG PO TABS
25.0000 mg | ORAL_TABLET | Freq: Every day | ORAL | Status: DC
  Administered 2015-03-26 – 2015-03-29 (×4): 25 mg via ORAL
  Filled 2015-03-26 (×3): qty 1
  Filled 2015-03-26: qty 3
  Filled 2015-03-26 (×3): qty 1

## 2015-03-26 MED ORDER — ARIPIPRAZOLE 2 MG PO TABS
2.0000 mg | ORAL_TABLET | Freq: Every day | ORAL | Status: DC
  Administered 2015-03-27 – 2015-03-28 (×2): 2 mg via ORAL
  Filled 2015-03-26 (×4): qty 1

## 2015-03-26 MED ORDER — DOXEPIN HCL 25 MG PO CAPS
50.0000 mg | ORAL_CAPSULE | Freq: Every evening | ORAL | Status: DC | PRN
  Administered 2015-03-26 – 2015-03-28 (×3): 50 mg via ORAL
  Filled 2015-03-26 (×3): qty 2
  Filled 2015-03-26: qty 6

## 2015-03-26 MED ORDER — IBUPROFEN 600 MG PO TABS
600.0000 mg | ORAL_TABLET | Freq: Four times a day (QID) | ORAL | Status: DC | PRN
  Administered 2015-03-26 – 2015-03-28 (×2): 600 mg via ORAL
  Filled 2015-03-26 (×2): qty 1

## 2015-03-26 NOTE — Progress Notes (Signed)
Recreation Therapy Notes  Date: 07.29.16 Time: 9:30 am Location: [address] Group Room  Group Topic: Stress Management  Goal Area(s) Addresses:  Patient will verbalize importance of using healthy stress management.  Patient will identify positive emotions associated with healthy stress management.   Intervention: Stress Management  Activity :  Progressive Muscle Relaxation.  LRT introduced and educated patients on the technique of progressive muscle relaxation.  A script was used to deliver the technique to the patients.  Patients were asked to follow the script read a loud by the LRT to engage in practicing the stress management technique.  Education:  Stress Management, Discharge Planning.   Education Outcome: Acknowledges edcuation/In group clarification offered/Needs additional education  Clinical Observations/Feedback: Patient did not attend group.   Carolyn Jennings, LRT/CTRS         Carolyn Jennings A 03/26/2015 3:14 PM 

## 2015-03-26 NOTE — Progress Notes (Signed)
[facility] Group Notes:  (Nursing/MHT/Case Management/Adjunct)  Date:  03/26/2015  Time:  2100  Type of Therapy:  wrap up group  Participation Level:  Active  Participation Quality:  Appropriate, Attentive, Sharing and Supportive  Affect:  Appropriate and Defensive  Cognitive:  Appropriate  Insight:  Improving  Engagement in Group:  Engaged  Modes of Intervention:  Clarification, Education and Support  Summary of Progress/Problems:  Carolyn Jennings 03/26/2015, 10:42 PM

## 2015-03-26 NOTE — Progress Notes (Signed)
  [facility] Adult Case Management Discharge Plan :  Will you be returning to the same living situation after discharge:  Yes,  w family At discharge, do you have transportation home?: Yes,  w family Do you have the ability to pay for your medications: Yes,  has insurance  Release of information consent forms completed and in the chart;  Patient's signature needed at discharge.  Patient to Follow up at: Follow-up Information    Follow up with Andee Poles On 03/29/2015.   Why:  Medication management appointment with [facility] on Monday August 1st at 2:15 pm. Please call office if you need to reschedule appointment.   Contact information:   [address], [address] [place], [place] [address] [phone]           Safety Planning and Suicide Prevention discussed: Yes,  patient declined collateral contact except by MD w husband; reviewed in group and given brochure  Have you used any form of tobacco in the last 30 days? (Cigarettes, Smokeless Tobacco, Cigars, and/or Pipes): Yes  Has patient been referred to the Quitline?: Patient refused referral  Sallee Lange 03/26/2015, 10:41 AM

## 2015-03-26 NOTE — Progress Notes (Signed)
D:Affect is sad/flat,mood is depressed. States that her goal is to continue working on recovery by attending and participating in all groups and activities today.Says that she still feels depressed but is hopeful that things will continue to improve for her. A:Support and encouragement offered. R:Receptive. No complaints of pain or problems at this time.

## 2015-03-26 NOTE — [facility] Group Notes (Signed)
[facility] LCSW Group Therapy  03/26/2015 2:35 PM  Type of Therapy:  Group Therapy  Participation Level:  Minimal  Participation Quality:  Attentive  Affect:  Depressed  Cognitive:  Appropriate  Insight:  Developing/Improving  Engagement in Therapy:  Developing/Improving  Modes of Intervention:  Discussion, Exploration and Problem-solving  Summary of Progress/Problems: The topic for today was feelings about relapse. Pt discussed what relapse prevention is to them and identified triggers that they are on the path to relapse. Pt processed their feeling towards relapse and was able to relate to peers. Pt discussed coping skills that can be used for relapse prevention. Patient tearful when discussing choices about residential sa tx program, sadness re leaving adolescent daughter.  Fearful of possible prison time due to legal charges which are unclear at moment.  Group gave support and encouragement.   Sallee Lange 03/26/2015, 2:35 PM

## 2015-03-26 NOTE — Progress Notes (Signed)
[facility] MD Progress Note  03/26/2015 7:02 PM Carolyn Jennings  MRN:  [id number] Subjective:  Carolyn Jennings is not tolerating the Latuda too well. States she remembered she took it before and the same thing happened. Still not clear why she cant have the Adderall or the Xanax. She does remember having done better on Abilify Principal Problem: Bipolar 1 disorder Diagnosis:   Patient Active Problem List   Diagnosis Date Noted  . Bipolar 1 disorder [F31.9] 03/23/2015  . Alcohol abuse [F10.10] 03/23/2015  . ADHD (attention deficit hyperactivity disorder) [F90.9] 03/23/2015  . Paranoia [F22] 03/23/2015  . Bipolar affective disorder, depressed, mild [F31.31] 11/08/2014  . Polysubstance abuse [F19.10] 11/08/2014  . Suicidal ideations [R45.851] 11/08/2014  . Fatty infiltration of liver [K76.0] 02/28/2013  . Macrocytosis without anemia [D75.89] 02/28/2013  . Elevation of level of transaminase or lactic acid dehydrogenase (LDH) [R74.0] 02/28/2013  . Rhabdomyolysis [M62.82] 12/07/2011  . Accidental poisoning by unspecified carbon monoxide(E868.9) [T58.91XA] 12/07/2011  . UTI (lower urinary tract infection) [N39.0] 12/07/2011   Total Time spent with patient: 30 minutes   Past Medical History:  Past Medical History  Diagnosis Date  . Bipolar 1 disorder   . Anxiety   . Depression   . ASCUS with positive high risk HPV 2007  . Migraines   . ADD (attention deficit disorder)    History reviewed. No pertinent past surgical history. Family History:  Family History  Problem Relation Age of Onset  . Heart disease Father   . Hypertension Father   . Heart attack Father   . Diabetes Mother     Type 1   Social History:  History  Alcohol Use  . 1.1 oz/week  . 1 Glasses of wine, 1 Standard drinks or equivalent per week    Comment: every week     History  Drug Use No    History   Social History  . Marital Status: Married    Spouse Name: N/A  . Number of Children: N/A  . Years of Education:  N/A   Social History Main Topics  . Smoking status: Current Every Day Smoker -- 1.50 packs/day for 15 years    Types: Cigarettes  . Smokeless tobacco: Not on file  . Alcohol Use: 1.1 oz/week    1 Glasses of wine, 1 Standard drinks or equivalent per week     Comment: every week  . Drug Use: No  . Sexual Activity: Yes    Birth Control/ Protection: None   Other Topics Concern  . None   Social History Narrative   Additional History:    Sleep: Fair  Appetite:  Fair   Assessment:   Musculoskeletal: Strength & Muscle Tone: within normal limits Gait & Station: normal Patient leans: normal   Psychiatric Specialty Exam: Physical Exam  ROS  Blood pressure 117/79, pulse 88, temperature 98.2 F (36.8 C), temperature source Oral, resp. rate 18, height 5\' 1"  (1.549 m), weight 57.153 kg (126 lb), last menstrual period 03/19/2015.Body mass index is 23.82 kg/(m^2).  General Appearance: Fairly Groomed  Patent attorney::  Fair  Speech:  Clear and Coherent  Volume:  fluctuates  Mood:  Anxious and worried  Affect:  Restricted  Thought Process:  Coherent and Goal Directed  Orientation:  Full (Time, Place, and Person)  Thought Content:  symptoms events worries concerns  Suicidal Thoughts:  No  Homicidal Thoughts:  No  Memory:  Immediate;   Fair Recent;   Fair Remote;   Fair  Judgement:  Fair  Insight:  Present and Shallow  Psychomotor Activity:  Restlessness  Concentration:  Fair  Recall:  [employer] of Knowledge:Fair  Language: Fair  Akathisia:  No  Handed:  Right  AIMS (if indicated):     Assets:  Desire for Improvement  ADL's:  Intact  Cognition: WNL  Sleep:  Number of Hours: 6.75     Current Medications: Current Facility-Administered Medications  Medication Dose Route Frequency Provider Last Rate Last Dose  . acetaminophen (TYLENOL) tablet 650 mg  650 mg Oral Q6H PRN Evanna Burkett, NP      . alum & mag hydroxide-simeth (MAALOX/MYLANTA) 200-200-20 MG/5ML suspension 30  mL  30 mL Oral Q4H PRN Kizzie Fantasia, NP      . [START ON 03/27/2015] ARIPiprazole (ABILIFY) tablet 2 mg  2 mg Oral Daily Rachael Fee, MD      . doxepin (SINEQUAN) capsule 50 mg  50 mg Oral QHS PRN Rachael Fee, MD      . ibuprofen (ADVIL,MOTRIN) tablet 600 mg  600 mg Oral Q6H PRN Rachael Fee, MD      . lamoTRIgine (LAMICTAL) tablet 25 mg  25 mg Oral Daily Rachael Fee, MD   25 mg at 03/26/15 1636  . [START ON 03/27/2015] LORazepam (ATIVAN) tablet 1 mg  1 mg Oral Daily Rachael Fee, MD      . magnesium hydroxide (MILK OF MAGNESIA) suspension 30 mL  30 mL Oral Daily PRN Evanna Burkett, NP      . nicotine (NICODERM CQ - dosed in mg/24 hours) patch 21 mg  21 mg Transdermal Daily Rachael Fee, MD   21 mg at 03/26/15 0830    Lab Results: No results found for this or any previous visit (from the past 48 hour(s)).  Physical Findings: AIMS: Facial and Oral Movements Muscles of Facial Expression: None, normal Lips and Perioral Area: None, normal Jaw: None, normal Tongue: None, normal,Extremity Movements Upper (arms, wrists, hands, fingers): None, normal Lower (legs, knees, ankles, toes): None, normal, Trunk Movements Neck, shoulders, hips: None, normal, Overall Severity Severity of abnormal movements (highest score from questions above): None, normal Incapacitation due to abnormal movements: None, normal Patient's awareness of abnormal movements (rate only patient's report): No Awareness, Dental Status Current problems with teeth and/or dentures?: No Does patient usually wear dentures?: No  CIWA:  CIWA-Ar Total: 0 COWS:  COWS Total Score: 1  Treatment Plan Summary: Daily contact with patient to assess and evaluate symptoms and progress in treatment and Medication management Supportive approach/coping skills Benzodiazepine dependence/alcohol abuse;  complete the detox protocol Mood inestbaility; will start Abilify 2 mg/lamictal 25 mg daily Work a relapse prevention plan Explore  residential treatment options Work to improve reality testing Medical Decision Making:  Review of Psycho-Social Stressors (1), Review of Medication Regimen & Side Effects (2) and Review of New Medication or Change in Dosage (2)     Arbor Leer A 03/26/2015, 7:02 PM

## 2015-03-26 NOTE — Progress Notes (Signed)
D: Pt approached the writer asking for "everything she could get". Writer informed the pt that there was no scheduled meds, however did inform the pt that there were prn meds available if she depending upon her symptoms. Pt stated, "how am I supposed to know". Writer asked pt to describe her problems or concerns. Pt informed the writer that she was having difficulty sleeping and wanted ativan. Writer informed pt that ativan wasn't available, but that she had vistaril and doxepin for sleep as well as anxiety.   A: Pt was still irritated, but accepted the prn meds. 15 min checks continued for safety.  Support and encouragement was offered.   R: Pt remains safe.

## 2015-03-26 NOTE — Progress Notes (Signed)
D: When asked about her day pt stated, "I get anxiety real bad. I wasn't able to sleep at all with all these crazy dreams". However, after several minutes pt began to discuss her relationship with her husband. At times the writer found it difficult to follow the pt in conversation. Pt spoke about her attorneys, the police officers, and her dr.  However, pt had no questions or concerns at this time.  A:  Support and encouragement was offered. 15 min checks continued for safety.  R: Pt remains safe.

## 2015-03-26 NOTE — [facility] Group Notes (Signed)
[facility] LCSW Aftercare Discharge Planning Group Note   03/26/2015 10:25 AM  Participation Quality:  limited  Mood/Affect:  Excited and Lethargic  Depression Rating:  10  Anxiety Rating:  10  Thoughts of Suicide:  No Will you contract for safety?   NA  Current AVH:  No  Plan for Discharge/Comments:  Has current provider - Emerson Monte - and follow up appt in place; wants referral to [facility] outpatient as wants to change provider; will be referred to [facility] at MD request  Transportation Means: family  Supports: husband, encouraged to access 12 step groups  Sallee Lange

## 2015-03-27 MED ORDER — GABAPENTIN 100 MG PO CAPS
200.0000 mg | ORAL_CAPSULE | Freq: Three times a day (TID) | ORAL | Status: DC
  Administered 2015-03-27 – 2015-03-29 (×6): 200 mg via ORAL
  Filled 2015-03-27 (×3): qty 2
  Filled 2015-03-27: qty 18
  Filled 2015-03-27 (×2): qty 2
  Filled 2015-03-27: qty 18
  Filled 2015-03-27 (×4): qty 2
  Filled 2015-03-27: qty 18

## 2015-03-27 MED ORDER — PRAZOSIN HCL 1 MG PO CAPS
1.0000 mg | ORAL_CAPSULE | Freq: Every day | ORAL | Status: DC
  Administered 2015-03-27 – 2015-03-28 (×2): 1 mg via ORAL
  Filled 2015-03-27 (×2): qty 1
  Filled 2015-03-27: qty 3
  Filled 2015-03-27 (×2): qty 1

## 2015-03-27 NOTE — Progress Notes (Signed)
Patient ID: Carolyn Jennings, female   DOB: 1970-10-09, 44 y.o.   MRN: [id number] Adult Psychoeducational Group Note  Date:  03/27/2015 Time: 09:15 am  Group Topic/Focus:  Orientation:   The focus of this group is to educate the patient on the purpose and policies of crisis stabilization and provide a format to answer questions about their admission.  The group details unit policies and expectations of patients while admitted.  Participation Level:  Did Not Attend  Participation Quality: n/a  Affect: n/a  Cognitive:  n/a  Insight:  n/a  Engagement in Group: n/a  Modes of Intervention:  Activity, Discussion, Orientation and Support  Additional Comments:  Pt did not attend group. Pt in bed asleep.   Aurora Mask 03/27/2015, 11:02 AM

## 2015-03-27 NOTE — Progress Notes (Signed)
Patient ID: Carolyn Jennings, female   DOB: 02-21-1971, 44 y.o.   MRN: [id number]  Pt currently presents with a flat affect and irritable, depressed behavior. Per self inventory, pt rates depression at a 8, hopelessness 8 and anxiety 9. Pt's daily goal is "self care" and they intend to do so by "group." Pt reports poor sleep, poor concentration, low energy and a fair appetite. Pt remains in bed for most of the day today, pt states "the medications make me sleepy."  Pt provided with medications per providers orders. Pt's labs and vitals were monitored throughout the day. Pt supported emotionally and encouraged to express concerns and questions. Pt educated on medications.   Pt's safety ensured with 15 minute and environmental checks. Pt currently denies SI/HI and A/V hallucinations. Pt verbally agrees to seek staff if SI/HI or A/VH occurs and to consult with staff before acting on these thoughts. Pt reports to writer that she has pain with urination due to "a cyst on my ovary that is too deep for the doctor to take out." Will continue POC.

## 2015-03-27 NOTE — [facility] Group Notes (Signed)
[facility] Group Notes: (Clinical Social Work)   03/27/2015      Type of Therapy:  Group Therapy   Participation Level:  Did Not Attend despite MHT prompting - SHE CAME INTO ROOM ABOUT 40 MINUTES AFTER IT STARTED, STAYED FOR ABOUT 5 MINUTES, THEN LEFT AGAIN, RETURNED AND LEFT AGAIN.   Ambrose Mantle, LCSW 03/27/2015, 1:04 PM

## 2015-03-27 NOTE — Progress Notes (Signed)
[facility] Group Notes:  (Nursing/MHT/Case Management/Adjunct)  Date:  03/27/2015  Time:  2100 Type of Therapy:  wrap up group  Participation Level:  Active  Participation Quality:  Appropriate, Inattentive, Redirectable, Sharing and Supportive  Affect:  Anxious  Cognitive:  Alert  Insight:  Lacking  Engagement in Group:  Developing/Improving, Distracting and Supportive  Modes of Intervention:  Clarification, Education and Support  Summary of Progress/Problems:  Carolyn Jennings 03/27/2015, 10:27 PM

## 2015-03-27 NOTE — Plan of Care (Signed)
Problem: Ineffective individual coping Goal: STG: Patient will remain free from self harm Outcome: Progressing Pt remains safe at this time with continuous monitoring     

## 2015-03-27 NOTE — Progress Notes (Signed)
Patient ID: Carolyn Jennings, female   DOB: Feb 07, 1971, 44 y.o.   MRN: [id number] [facility] MD Progress Note  03/27/2015 6:45 PM Carolyn Jennings  MRN:  [id number]  Subjective:  Carolyn Jennings is worried, says she is agitated today. Says she still does not understand why she can't have the Adderall, Klonopinor or Xanax. She says she has a court date coming up, does not know how it will play out. Says, not going home on Adderall & or Klonopin is going to suck. She says it will be a disaster for her. She complains of bad nightmares & agitation.  Principal Problem: Bipolar 1 disorder Diagnosis:   Patient Active Problem List   Diagnosis Date Noted  . Bipolar 1 disorder [F31.9] 03/23/2015  . Alcohol abuse [F10.10] 03/23/2015  . ADHD (attention deficit hyperactivity disorder) [F90.9] 03/23/2015  . Paranoia [F22] 03/23/2015  . Bipolar affective disorder, depressed, mild [F31.31] 11/08/2014  . Polysubstance abuse [F19.10] 11/08/2014  . Suicidal ideations [R45.851] 11/08/2014  . Fatty infiltration of liver [K76.0] 02/28/2013  . Macrocytosis without anemia [D75.89] 02/28/2013  . Elevation of level of transaminase or lactic acid dehydrogenase (LDH) [R74.0] 02/28/2013  . Rhabdomyolysis [M62.82] 12/07/2011  . Accidental poisoning by unspecified carbon monoxide(E868.9) [T58.91XA] 12/07/2011  . UTI (lower urinary tract infection) [N39.0] 12/07/2011   Total Time spent with patient: 30 minutes   Past Medical History:  Past Medical History  Diagnosis Date  . Bipolar 1 disorder   . Anxiety   . Depression   . ASCUS with positive high risk HPV 2007  . Migraines   . ADD (attention deficit disorder)    History reviewed. No pertinent past surgical history. Family History:  Family History  Problem Relation Age of Onset  . Heart disease Father   . Hypertension Father   . Heart attack Father   . Diabetes Mother     Type 1   Social History:  History  Alcohol Use  . 1.1 oz/week  . 1 Glasses of wine, 1  Standard drinks or equivalent per week    Comment: every week     History  Drug Use No    History   Social History  . Marital Status: Married    Spouse Name: N/A  . Number of Children: N/A  . Years of Education: N/A   Social History Main Topics  . Smoking status: Current Every Day Smoker -- 1.50 packs/day for 15 years    Types: Cigarettes  . Smokeless tobacco: Not on file  . Alcohol Use: 1.1 oz/week    1 Glasses of wine, 1 Standard drinks or equivalent per week     Comment: every week  . Drug Use: No  . Sexual Activity: Yes    Birth Control/ Protection: None   Other Topics Concern  . None   Social History Narrative   Additional History:    Sleep: Fair  Appetite:  Fair  Musculoskeletal: Strength & Muscle Tone: within normal limits Gait & Station: normal Patient leans: normal  Psychiatric Specialty Exam: Physical Exam  Review of Systems  Constitutional: Negative.   HENT: Negative.   Eyes: Negative.   Cardiovascular: Negative.   Gastrointestinal: Negative.   Genitourinary: Negative.   Skin: Negative.   Neurological: Negative.   Endo/Heme/Allergies: Negative.   Psychiatric/Behavioral: Positive for depression and substance abuse (Alcohol abuse). Negative for suicidal ideas, hallucinations and memory loss. The patient is nervous/anxious and has insomnia.     Blood pressure 105/81, pulse 90, temperature 98.7 F (37.1 C),  temperature source Oral, resp. rate 18, height 5\' 1"  (1.549 m), weight 57.153 kg (126 lb), last menstrual period 03/19/2015.Body mass index is 23.82 kg/(m^2).  General Appearance: Fairly Groomed  Patent attorney::  Fair  Speech:  Clear and Coherent  Volume:  fluctuates  Mood:  Anxious and worried  Affect:  Restricted  Thought Process:  Coherent and Goal Directed  Orientation:  Full (Time, Place, and Person)  Thought Content:  symptoms events worries concerns  Suicidal Thoughts:  No  Homicidal Thoughts:  No  Memory:  Immediate;   Fair Recent;    Fair Remote;   Fair  Judgement:  Fair  Insight:  Present and Shallow  Psychomotor Activity:  Restlessness,  agitated  Concentration:  Fair  Recall:  [employer] of Knowledge:Fair  Language: Fair  Akathisia:  No  Handed:  Right  AIMS (if indicated):     Assets:  Desire for Improvement  ADL's:  Intact  Cognition: WNL  Sleep:  Number of Hours: 6.75     Current Medications: Current Facility-Administered Medications  Medication Dose Route Frequency Provider Last Rate Last Dose  . acetaminophen (TYLENOL) tablet 650 mg  650 mg Oral Q6H PRN Evanna Burkett, NP      . alum & mag hydroxide-simeth (MAALOX/MYLANTA) 200-200-20 MG/5ML suspension 30 mL  30 mL Oral Q4H PRN Evanna Burkett, NP      . ARIPiprazole (ABILIFY) tablet 2 mg  2 mg Oral Daily Rachael Fee, MD   2 mg at 03/27/15 0844  . doxepin (SINEQUAN) capsule 50 mg  50 mg Oral QHS PRN Rachael Fee, MD   50 mg at 03/26/15 2126  . ibuprofen (ADVIL,MOTRIN) tablet 600 mg  600 mg Oral Q6H PRN Rachael Fee, MD   600 mg at 03/26/15 2127  . lamoTRIgine (LAMICTAL) tablet 25 mg  25 mg Oral Daily Rachael Fee, MD   25 mg at 03/27/15 0844  . magnesium hydroxide (MILK OF MAGNESIA) suspension 30 mL  30 mL Oral Daily PRN Evanna Burkett, NP      . nicotine (NICODERM CQ - dosed in mg/24 hours) patch 21 mg  21 mg Transdermal Daily Rachael Fee, MD   21 mg at 03/27/15 1610    Lab Results: No results found for this or any previous visit (from the past 48 hour(s)).  Physical Findings: AIMS: Facial and Oral Movements Muscles of Facial Expression: None, normal Lips and Perioral Area: None, normal Jaw: None, normal Tongue: None, normal,Extremity Movements Upper (arms, wrists, hands, fingers): None, normal Lower (legs, knees, ankles, toes): None, normal, Trunk Movements Neck, shoulders, hips: None, normal, Overall Severity Severity of abnormal movements (highest score from questions above): None, normal Incapacitation due to abnormal movements:  None, normal Patient's awareness of abnormal movements (rate only patient's report): No Awareness, Dental Status Current problems with teeth and/or dentures?: No Does patient usually wear dentures?: No  CIWA:  CIWA-Ar Total: 0 COWS:  COWS Total Score: 1  Treatment Plan Summary: Daily contact with patient to assess and evaluate symptoms and progress in treatment and Medication management Supportive approach/coping skills Benzodiazepine dependence/alcohol abuse;  complete the detox protocol (Completed). Mood inestbaility; Continue Abilify 2 mg/lamictal 25 mg daily. Nightmares: Initiate Minipress 1 mg Q hs. Agitation: Initiate Neurontin 200 mg tid . Work a relapse Astronomer Explore residential treatment options Work to Building services engineer Decision Making:  New problem, with additional work up planned, Review of Psycho-Social Stressors (1), Established Problem, Worsening (2), Review of  Medication Regimen & Side Effects (2) and Review of New Medication or Change in Dosage (2)  Sanjuana Kava, PMHNP, FNP-BC 03/27/2015, 6:45 PM  I reviewed chart and agreed with the findings and treatment Plan.  Kathryne Sharper, MD

## 2015-03-28 MED ORDER — ARIPIPRAZOLE 5 MG PO TABS
5.0000 mg | ORAL_TABLET | Freq: Every day | ORAL | Status: DC
Start: 2015-03-29 — End: 2015-03-29
  Administered 2015-03-29: 5 mg via ORAL
  Filled 2015-03-28: qty 3
  Filled 2015-03-28 (×2): qty 1

## 2015-03-28 NOTE — Progress Notes (Signed)
D: Pt informed the writer that she had a good visit with her husband today. Stated, "even after all the "trumped up charges she doesn't hate him". Then like yesterday the pt began talking about people on meth, and discussing judges. Pt voiced no questions or concerns.  A:  Support and encouragement was offered. 15 min checks continued for safety.  R: Pt remains safe.

## 2015-03-28 NOTE — [facility] Group Notes (Signed)
[facility] Group Notes:  (Clinical Social Work)  03/28/2015  10:00-11:00AM  Summary of Progress/Problems:   The main focus of today's process group was to   1)  discuss the importance of adding supports  2)  define health supports versus unhealthy supports  3)  identify the patient's current unhealthy supports and plan how to handle them  4)  Identify the patient's current healthy supports and plan what to add.  An emphasis was placed on using counselor, doctor, therapy groups, 12-step groups, and problem-specific support groups to expand supports.    The patient expressed full comprehension of the concepts presented, and agreed that there is a need to add more supports.  The patient stated she has no supports, but then did talk about her husband at times being healthy, at other times unhealthy, for her.  She said her children are healthy for her.  She listened intently throughout the remainder of group.  Type of Therapy:  Process Group with Motivational Interviewing  Participation Level:  Active  Participation Quality:  Attentive  Affect:  Blunted  Cognitive:  Alert  Insight:  Engaged  Engagement in Therapy:  Engaged  Modes of Intervention:   Education, Support and Processing, Activity  Ambrose Mantle, LCSW 03/28/2015

## 2015-03-28 NOTE — Progress Notes (Signed)
Adult Psychoeducational Group Note  Date:  03/28/2015 Time: 09:15am  Group Topic/Focus:  Identifying Needs:   The focus of this group is to help patients identify their personal needs that have been historically problematic and identify healthy behaviors to address their needs.  Participation Level:  Active  Participation Quality:  Appropriate and Attentive  Affect:  Flat  Cognitive:  Alert, Appropriate and Oriented  Insight: Good  Engagement in Group:  Improving  Modes of Intervention:  Activity, Confrontation, Education and Support  Additional Comments:  Pt able to identify one positive support system that will encourage them to make healthy choices post discharge.  Aurora Mask 03/28/2015, 10:11 AM

## 2015-03-28 NOTE — Plan of Care (Signed)
Problem: Ineffective individual coping Goal: LTG: Patient will report a decrease in negative feelings Outcome: Progressing Pt denies any withdrawal symptoms

## 2015-03-28 NOTE — Progress Notes (Signed)
Patient ID: Carolyn Jennings, female   DOB: 08/19/1971, 44 y.o.   MRN: [id number]   Pt currently presents with a flat affect and depressed behavior. Pt complains of pain with urination. Pt states "I couldn't believe how big it was when I felt it, it's complicated, I do what my husband wants." Pt did not want to continue the conversation. Pt forwards little to Clinical research associate. Interacts positively with peers.   Pt provided with medications per providers orders. Pt's labs and vitals were monitored throughout the day. Pt supported emotionally and encouraged to express concerns and questions. Pt educated on medications.   Pt's safety ensured with 15 minute and environmental checks. Pt currently denies SI/HI and A/V hallucinations. Pt verbally agrees to seek staff if SI/HI or A/VH occurs and to consult with staff before acting on these thoughts. Will continue POC.

## 2015-03-28 NOTE — Progress Notes (Signed)
Patient ID: Carolyn Jennings, female   DOB: December 07, 1970, 44 y.o.   MRN: [id number] Patient ID: Carolyn Jennings, female   DOB: 1971-06-09, 44 y.o.   MRN: [id number] [facility] MD Progress Note  03/28/2015 3:38 PM Carolyn Jennings  MRN:  [id number]  Subjective:  Carolyn Jennings says, "I have a bad headache pains. I did not sleep last night. I'm having nightmares. I have bad anxiety. Are you serious about not giving me Klonopin, Ativan or Xanax? How do you expect me to function out there? My husband scares me. I'm in trouble because of him. How do I deal with my court stressors without Xanax or klonopin? I would like to consider this place in [place] to go to after discharge". Increased Abilify from 2 mg to 5 mg to help with mood control.  Principal Problem: Bipolar 1 disorder Diagnosis:   Patient Active Problem List   Diagnosis Date Noted  . Bipolar 1 disorder [F31.9] 03/23/2015  . Alcohol abuse [F10.10] 03/23/2015  . ADHD (attention deficit hyperactivity disorder) [F90.9] 03/23/2015  . Paranoia [F22] 03/23/2015  . Bipolar affective disorder, depressed, mild [F31.31] 11/08/2014  . Polysubstance abuse [F19.10] 11/08/2014  . Suicidal ideations [R45.851] 11/08/2014  . Fatty infiltration of liver [K76.0] 02/28/2013  . Macrocytosis without anemia [D75.89] 02/28/2013  . Elevation of level of transaminase or lactic acid dehydrogenase (LDH) [R74.0] 02/28/2013  . Rhabdomyolysis [M62.82] 12/07/2011  . Accidental poisoning by unspecified carbon monoxide(E868.9) [T58.91XA] 12/07/2011  . UTI (lower urinary tract infection) [N39.0] 12/07/2011   Total Time spent with patient: 20 minutes   Past Medical History:  Past Medical History  Diagnosis Date  . Bipolar 1 disorder   . Anxiety   . Depression   . ASCUS with positive high risk HPV 2007  . Migraines   . ADD (attention deficit disorder)    History reviewed. No pertinent past surgical history. Family History:  Family History  Problem Relation Age of  Onset  . Heart disease Father   . Hypertension Father   . Heart attack Father   . Diabetes Mother     Type 1   Social History:  History  Alcohol Use  . 1.1 oz/week  . 1 Glasses of wine, 1 Standard drinks or equivalent per week    Comment: every week     History  Drug Use No    History   Social History  . Marital Status: Married    Spouse Name: N/A  . Number of Children: N/A  . Years of Education: N/A   Social History Main Topics  . Smoking status: Current Every Day Smoker -- 1.50 packs/day for 15 years    Types: Cigarettes  . Smokeless tobacco: Not on file  . Alcohol Use: 1.1 oz/week    1 Glasses of wine, 1 Standard drinks or equivalent per week     Comment: every week  . Drug Use: No  . Sexual Activity: Yes    Birth Control/ Protection: None   Other Topics Concern  . None   Social History Narrative   Additional History:    Sleep: Fair  Appetite:  Fair  Musculoskeletal: Strength & Muscle Tone: within normal limits Gait & Station: normal Patient leans: normal  Psychiatric Specialty Exam: Physical Exam  Review of Systems  Constitutional: Negative.   HENT: Negative.   Eyes: Negative.   Cardiovascular: Negative.   Gastrointestinal: Negative.   Genitourinary: Negative.   Skin: Negative.   Neurological: Negative.   Endo/Heme/Allergies: Negative.   Psychiatric/Behavioral:  Positive for depression and substance abuse (Alcohol abuse). Negative for suicidal ideas, hallucinations and memory loss. The patient is nervous/anxious and has insomnia.     Blood pressure 118/84, pulse 99, temperature 98.2 F (36.8 C), temperature source Oral, resp. rate 20, height  (1.549 m), weight 57.153 kg (126 lb), last menstrual period 03/19/2015.Body mass index is 23.82 kg/(m^2).  General Appearance: Fairly Groomed  Patent attorney::  Fair  Speech:  Clear and Coherent  Volume:  fluctuates  Mood:  Anxious and worried  Affect:  Restricted  Thought Process:  Coherent and Goal  Directed  Orientation:  Full (Time, Place, and Person)  Thought Content:  symptoms events worries concerns  Suicidal Thoughts:  No  Homicidal Thoughts:  No  Memory:  Immediate;   Fair Recent;   Fair Remote;   Fair  Judgement:  Fair  Insight:  Present and Shallow  Psychomotor Activity:  Restlessness,  agitated  Concentration:  Fair  Recall:  [employer] of Knowledge:Fair  Language: Fair  Akathisia:  No  Handed:  Right  AIMS (if indicated):     Assets:  Desire for Improvement  ADL's:  Intact  Cognition: WNL  Sleep:  Number of Hours: 6.75     Current Medications: Current Facility-Administered Medications  Medication Dose Route Frequency Provider Last Rate Last Dose  . acetaminophen (TYLENOL) tablet 650 mg  650 mg Oral Q6H PRN Evanna Burkett, NP      . alum & mag hydroxide-simeth (MAALOX/MYLANTA) 200-200-20 MG/5ML suspension 30 mL  30 mL Oral Q4H PRN Evanna Burkett, NP      . ARIPiprazole (ABILIFY) tablet 2 mg  2 mg Oral Daily Rachael Fee, MD   2 mg at 03/28/15 0834  . doxepin (SINEQUAN) capsule 50 mg  50 mg Oral QHS PRN Rachael Fee, MD   50 mg at 03/27/15 2143  . gabapentin (NEURONTIN) capsule 200 mg  200 mg Oral TID Sanjuana Kava, NP   200 mg at 03/28/15 1132  . ibuprofen (ADVIL,MOTRIN) tablet 600 mg  600 mg Oral Q6H PRN Rachael Fee, MD   600 mg at 03/28/15 1132  . lamoTRIgine (LAMICTAL) tablet 25 mg  25 mg Oral Daily Rachael Fee, MD   25 mg at 03/28/15 [phone]  . magnesium hydroxide (MILK OF MAGNESIA) suspension 30 mL  30 mL Oral Daily PRN Evanna Burkett, NP      . nicotine (NICODERM CQ - dosed in mg/24 hours) patch 21 mg  21 mg Transdermal Daily Rachael Fee, MD   21 mg at 03/28/15 0834  . prazosin (MINIPRESS) capsule 1 mg  1 mg Oral QHS Sanjuana Kava, NP   1 mg at 03/27/15 2142    Lab Results: No results found for this or any previous visit (from the past 48 hour(s)).  Physical Findings: AIMS: Facial and Oral Movements Muscles of Facial Expression: None, normal Lips  and Perioral Area: None, normal Jaw: None, normal Tongue: None, normal,Extremity Movements Upper (arms, wrists, hands, fingers): None, normal Lower (legs, knees, ankles, toes): None, normal, Trunk Movements Neck, shoulders, hips: None, normal, Overall Severity Severity of abnormal movements (highest score from questions above): None, normal Incapacitation due to abnormal movements: None, normal Patient's awareness of abnormal movements (rate only patient's report): No Awareness, Dental Status Current problems with teeth and/or dentures?: No Does patient usually wear dentures?: No  CIWA:  CIWA-Ar Total: 0 COWS:  COWS Total Score: 1  Treatment Plan Summary: Daily contact with patient to  assess and evaluate symptoms and progress in treatment and Medication management Supportive approach/coping skills Benzodiazepine dependence/alcohol abuse;  complete the detox protocol (Completed). Mood inestbaility; Increased Abilify to 5 mg/lamictal 25 mg daily. Nightmares: Continue Minipress 1 mg Q hs. Agitation: Continue Neurontin 200 mg tid . Work a relapse Astronomer Explore residential treatment options Work to Building services engineer Decision Making:  New problem, with additional work up planned, Review of Psycho-Social Stressors (1), Established Problem, Worsening (2), Review of Medication Regimen & Side Effects (2) and Review of New Medication or Change in Dosage (2)  Sanjuana Kava, PMHNP, FNP-BC 03/28/2015, 3:38 PM  I reviewed chart and agreed with the findings and treatment Plan.  Kathryne Sharper, MD

## 2015-03-28 NOTE — Progress Notes (Signed)
Patient did attend the evening speaker AA meeting.  

## 2015-03-29 DIAGNOSIS — F900 Attention-deficit hyperactivity disorder, predominantly inattentive type: Secondary | ICD-10-CM | POA: Insufficient documentation

## 2015-03-29 MED ORDER — DOXEPIN HCL 50 MG PO CAPS: 50.0000 mg | ORAL_CAPSULE | Freq: Every evening | ORAL | Status: DC | PRN

## 2015-03-29 MED ORDER — NICOTINE 21 MG/24HR TD PT24: 21.0000 mg | MEDICATED_PATCH | Freq: Every day | TRANSDERMAL | Status: DC

## 2015-03-29 MED ORDER — LAMOTRIGINE 25 MG PO TABS: 25.0000 mg | ORAL_TABLET | Freq: Every day | ORAL | Status: DC

## 2015-03-29 MED ORDER — PRAZOSIN HCL 1 MG PO CAPS: 1.0000 mg | ORAL_CAPSULE | Freq: Every day | ORAL | Status: DC

## 2015-03-29 MED ORDER — GABAPENTIN 100 MG PO CAPS: 200.0000 mg | ORAL_CAPSULE | Freq: Three times a day (TID) | ORAL | Status: DC

## 2015-03-29 MED ORDER — ARIPIPRAZOLE 5 MG PO TABS
5.0000 mg | ORAL_TABLET | Freq: Every day | ORAL | Status: DC
Start: 2015-03-29 — End: 2015-10-18

## 2015-03-29 NOTE — BHH Suicide Risk Assessment (Signed)
[facility] Discharge Suicide Risk Assessment   Demographic Factors:  NA  Total Time spent with patient: 30 minutes  Musculoskeletal: Strength & Muscle Tone: within normal limits Gait & Station: normal Patient leans: normal  Psychiatric Specialty Exam: Physical Exam  Review of Systems  Constitutional: Negative.   HENT: Negative.   Eyes: Negative.   Respiratory: Negative.   Cardiovascular: Negative.   Gastrointestinal: Negative.   Genitourinary: Negative.   Musculoskeletal: Negative.   Skin: Negative.   Neurological: Negative.   Endo/Heme/Allergies: Negative.   Psychiatric/Behavioral: The patient is nervous/anxious.     Blood pressure 109/80, pulse 101, temperature 99.2 F (37.3 C), temperature source Oral, resp. rate 17, height  (1.549 m), weight 57.153 kg (126 lb), last menstrual period 03/19/2015.Body mass index is 23.82 kg/(m^2).  General Appearance: Fairly Groomed  Patent attorney::  Fair  Speech:  Clear and Coherent409  Volume:  Decreased  Mood:  anxious worried about the legal situation wiht her DWI  Affect:  Appropriate  Thought Process:  Coherent and Goal Directed  Orientation:  Full (Time, Place, and Person)  Thought Content:  plans as she moves on  Suicidal Thoughts:  No  Homicidal Thoughts:  No  Memory:  Immediate;   Fair Recent;   Fair Remote;   Fair  Judgement:  Fair  Insight:  Present and Shallow  Psychomotor Activity:  Normal  Concentration:  Fair  Recall:  [employer] of Knowledge:Fair  Language: Fair  Akathisia:  No  Handed:  Right  AIMS (if indicated):     Assets:  Desire for Improvement Housing Social Support  Sleep:  Number of Hours: 6.75  Cognition: WNL  ADL's:  Intact   Have you used any form of tobacco in the last 30 days? (Cigarettes, Smokeless Tobacco, Cigars, and/or Pipes): Yes  Has this patient used any form of tobacco in the last 30 days? (Cigarettes, Smokeless Tobacco, Cigars, and/or Pipes) Yes, Prescription not provided because:  nicotine patch will be given  Mental Status Per Nursing Assessment::   On Admission:     Current Mental Status by Physician: In full contact with reality. There are no active SI plans or intent. Has gotten used to the idea she is not going to be given Benzodiazepines or Stimulants. She is still wanting to go to a residential treatment program.    Loss Factors: NA  Historical Factors: NA  Risk Reduction Factors:   Sense of responsibility to family, Religious beliefs about death and Living with another person, especially a relative  Continued Clinical Symptoms:  Bipolar Disorder:   Bipolar II Alcohol/Substance Abuse/Dependencies  Cognitive Features That Contribute To Risk:  None    Suicide Risk:  Minimal: No identifiable suicidal ideation.  Patients presenting with no risk factors but with morbid ruminations; may be classified as minimal risk based on the severity of the depressive symptoms  Principal Problem: Bipolar 1 disorder Discharge Diagnoses:  Patient Active Problem List   Diagnosis Date Noted  . Bipolar 1 disorder [F31.9] 03/23/2015  . Alcohol abuse [F10.10] 03/23/2015  . ADHD (attention deficit hyperactivity disorder) [F90.9] 03/23/2015  . Paranoia [F22] 03/23/2015  . Bipolar affective disorder, depressed, mild [F31.31] 11/08/2014  . Polysubstance abuse [F19.10] 11/08/2014  . Suicidal ideations [R45.851] 11/08/2014  . Fatty infiltration of liver [K76.0] 02/28/2013  . Macrocytosis without anemia [D75.89] 02/28/2013  . Elevation of level of transaminase or lactic acid dehydrogenase (LDH) [R74.0] 02/28/2013  . Rhabdomyolysis [M62.82] 12/07/2011  . Accidental poisoning by unspecified carbon monoxide(E868.9) [T58.91XA] 12/07/2011  .  UTI (lower urinary tract infection) [N39.0] 12/07/2011    Follow-up Information    Follow up with [facility] On 04/05/2015.   Why:  Appointment with counselor Kenyon Ana on Monday August 8th at 3pm who can get you established with  medication management appointments with Dr. Lajean Silvius. Please call office if you need to reschedule.   Contact information:   [address],  [place], [place] [address] [phone]       Plan Of Care/Follow-up recommendations:  Activity:  as tolerated Diet:  regular Follow up [facility] Is patient on multiple antipsychotic therapies at discharge:  No   Has Patient had three or more failed trials of antipsychotic monotherapy by history:  No  Recommended Plan for Multiple Antipsychotic Therapies: NA    Hinley Brimage A 03/29/2015, 2:02 PM

## 2015-03-29 NOTE — Progress Notes (Signed)
  [facility] Adult Case Management Discharge Plan :  Will you be returning to the same living situation after discharge:  Yes,  Patient plans to return home At discharge, do you have transportation home?: Yes, patient reports access to transportation Do you have the ability to pay for your medications: Yes,  patient will be provided with medication samples and prescriptions  Release of information consent forms completed and in the chart;  Patient's signature needed at discharge.  Patient to Follow up at: Follow-up Information    Follow up with [facility] On 04/05/2015.   Why:  Appointment with counselor Kenyon Ana on Monday August 8th at 3pm who can get you established with medication management appointments with Dr. Lajean Silvius. Please call office if you need to reschedule.   Contact information:   [address],  [place], [place] [address] [phone]       Patient denies SI/HI: Yes,  denies    Safety Planning and Suicide Prevention discussed: Yes,  with patient  Have you used any form of tobacco in the last 30 days? (Cigarettes, Smokeless Tobacco, Cigars, and/or Pipes): Yes  Has patient been referred to the Quitline?: Patient refused referral  Glenora Morocho, [place] 03/29/2015, 9:58 AM

## 2015-03-29 NOTE — Progress Notes (Signed)
D Pt.  Denies SI and HI,  No complaints of pain or discomfort noted. Pt. Does complain of anxiety and depression.  A Writer offered support and encouragement, discussed pt.'s day and reason for admission.    R Pt. Rates her depression a 9 and her anxiety a 9 as well.  Pt. Does appear to relax as we talk and laughs at intervals. Pt. Is tangential at times as she ruminates about her husband .  Pt. Is forthcoming if you allow her to speak but is very defensive if staff ask questions  Possibly d/t to patients paranoia. Pt. Remains safe on the unit.

## 2015-03-29 NOTE — BHH Group Notes (Signed)
   [facility] LCSW Aftercare Discharge Planning Group Note  03/29/2015  8:45 AM   Participation Quality: Alert, Appropriate and Oriented  Mood/Affect: Appropriate  Depression Rating: 10  Anxiety Rating: 10  Thoughts of Suicide: Pt denies SI/HI  Will you contract for safety? Yes  Current AVH: Pt denies  Plan for Discharge/Comments: Pt attended discharge planning group and actively participated in group. CSW provided pt with today's workbook. Patient plans to return home and reports that she would like a referral to another treatment provider other than [facility].  Transportation Means: Pt reports access to transportation  Supports: No supports mentioned at this time  Samuella Bruin, MSW, [employer] Clinical Social Worker [employer] [phone]

## 2015-03-29 NOTE — Progress Notes (Signed)
D)Pt. Was d/c to care of husband.  Pt. Denied SI/Hi and denied A/V hallucinations. Denied pain. Affect and mood appropriate for d/c.  Pt. Stated readiness for d/c.  A) Reviewed AVS , medications, follow up plans.  Prescriptions provided. Belongings returned. R) Pt. Receptive and indicated understanding of follow up plans.  Escorted to lobby

## 2015-03-29 NOTE — Progress Notes (Signed)
Recreation Therapy Notes  Date: 08.01.16 Time: 9:30 am Location: [address] Group Room  Group Topic: Stress Management  Goal Area(s) Addresses:  Patient will verbalize importance of using healthy stress management.  Patient will identify positive emotions associated with healthy stress management.   Intervention: Stress Management  Activity :  Guided Imagery Script.  LRT introduced and educated patients on the stress management technique of guided imagery script.  A script was read and patients were asked to follow a long as the LRT read the script a loud to engaged in the technique of guided imagery.  Education:  Stress Management, Discharge Planning.   Education Outcome: Acknowledges edcuation/In group clarification offered/Needs additional education  Clinical Observations/Feedback: Patient did not attend group.   Tyra Michelle, LRT/CTRS         Zakyah Yanes A 03/29/2015 1:18 PM 

## 2015-03-29 NOTE — Discharge Summary (Signed)
Physician Discharge Summary Note  Patient:  Carolyn Jennings is an 44 y.o., female MRN:  [id number] DOB:  02/01/1971 Patient phone:  [phone] (home)  Patient address:   [address] [place] [address],  Total Time spent with patient: 30 minutes  Date of Admission:  03/22/2015 Date of Discharge: 03/29/15  Reason for Admission:  Mood stabilization treatments  Principal Problem: Bipolar 1 disorder Discharge Diagnoses: Patient Active Problem List   Diagnosis Date Noted  . Attention deficit hyperactivity disorder (ADHD), predominantly inattentive type [F90.0]   . Bipolar 1 disorder [F31.9] 03/23/2015  . Alcohol abuse [F10.10] 03/23/2015  . ADHD (attention deficit hyperactivity disorder) [F90.9] 03/23/2015  . Paranoia [F22] 03/23/2015  . Bipolar affective disorder, depressed, mild [F31.31] 11/08/2014  . Polysubstance abuse [F19.10] 11/08/2014  . Suicidal ideations [R45.851] 11/08/2014  . Fatty infiltration of liver [K76.0] 02/28/2013  . Macrocytosis without anemia [D75.89] 02/28/2013  . Elevation of level of transaminase or lactic acid dehydrogenase (LDH) [R74.0] 02/28/2013  . Rhabdomyolysis [M62.82] 12/07/2011  . Accidental poisoning by unspecified carbon monoxide(E868.9) [T58.91XA] 12/07/2011  . UTI (lower urinary tract infection) [N39.0] 12/07/2011    Musculoskeletal: Strength & Muscle Tone: within normal limits Gait & Station: normal Patient leans: N/A  Psychiatric Specialty Exam: Physical Exam  Psychiatric: She has a normal mood and affect. Her speech is normal and behavior is normal. Judgment normal. Cognition and memory are normal.    Review of Systems  Constitutional: Negative.   HENT: Negative.   Eyes: Negative.   Respiratory: Negative.   Cardiovascular: Negative.   Gastrointestinal: Negative.   Genitourinary: Negative.   Musculoskeletal: Negative.   Skin: Negative.   Neurological: Negative.   Endo/Heme/Allergies: Negative.    Psychiatric/Behavioral: Positive for depression (Stabilized with current treatments). Negative for suicidal ideas, hallucinations, memory loss and substance abuse. The patient is not nervous/anxious and does not have insomnia.     Blood pressure 109/80, pulse 101, temperature 99.2 F (37.3 C), temperature source Oral, resp. rate 17, height 5\' 1"  (1.549 m), weight 57.153 kg (126 lb), last menstrual period 03/19/2015.Body mass index is 23.82 kg/(m^2).  See Physician SRA     Have you used any form of tobacco in the last 30 days? (Cigarettes, Smokeless Tobacco, Cigars, and/or Pipes): Yes  Has this patient used any form of tobacco in the last 30 days? (Cigarettes, Smokeless Tobacco, Cigars, and/or Pipes) Yes, Prescription not provided because: nicotine patch will be given  Past Medical History:  Past Medical History  Diagnosis Date  . Bipolar 1 disorder   . Anxiety   . Depression   . ASCUS with positive high risk HPV 2007  . Migraines   . ADD (attention deficit disorder)    History reviewed. No pertinent past surgical history. Family History:  Family History  Problem Relation Age of Onset  . Heart disease Father   . Hypertension Father   . Heart attack Father   . Diabetes Mother     Type 1   Social History:  History  Alcohol Use  . 1.1 oz/week  . 1 Glasses of wine, 1 Standard drinks or equivalent per week    Comment: every week     History  Drug Use No    History   Social History  . Marital Status: Married    Spouse Name: N/A  . Number of Children: N/A  . Years of Education: N/A   Social History Main Topics  . Smoking status: Current Every Day Smoker -- 1.50 packs/day for 15  years    Types: Cigarettes  . Smokeless tobacco: Not on file  . Alcohol Use: 1.1 oz/week    1 Glasses of wine, 1 Standard drinks or equivalent per week     Comment: every week  . Drug Use: No  . Sexual Activity: Yes    Birth Control/ Protection: None   Other Topics Concern  . None    Social History Narrative   Risk to Self: Is patient at risk for suicide?: Yes (pt contracts for safety) What has been your use of drugs/alcohol within the last 12 months?: Pt denies  Risk to Others:   Prior Inpatient Therapy:   Yes Prior Outpatient Therapy:   Yes  Level of Care:  OP  Hospital Course:    HIKARI TRIPP is an 44 y.o. female who was brought to the [facility] by her husband. Patient discussed a history of bipolar disorder stating that she has been off of her medications for the past two weeks. She reported she was in [facility] in March of this year and her medications were changed. Since March patient reported that her life "has gone down hill and her life is going no where". She separated from her husband and reported that she now lives in a crime and drug infested neighbor with her 62 year old daughter and her dog. She reported symptoms of sadness, crying, anxiety, panic attacks, trouble concentrating, poor memory, no appetite, sleeping only 2 hours a night and wanting to stay in bed all day. She stated that she feels hopeless and the 2 medications that were prescribed to her by her nurse practitioner (Depokate and Rexulti) have not been working so she did not make the effort to have them refilled. Patient also reported a history of alcohol abuse and 2 DWI's. She currently has no license and she has two court dates for the DWI's coming up in September. She recently started treatment at the [facility]. Patient stated that she "can't take it anymore" and that she thinks if she is "gone" it will all go away. She denied any specific plan but stated that she has thought of different ways that she would kill herself. She denied homicidal ideation, hallucinations or any problems with drugs although her medical record reflects a past diagnosis of polysubstance abuse.         RAYLEN KEN was admitted to the adult 300 unit where she was evaluated and her  symptoms were identified. Medication management was discussed and implemented. Patient completed the ativan protocol for the purposes of detox from benzodiazepines. Patient was started on several new medications to manage her symptoms to include Abilify 5 mg daily, Lamictal 25 mg daily, Minipress 1 mg at bedtime, Doxepin 50 mg hs prn, and Neurontin 200 mg TID.  She was encouraged to participate in unit programming. Medical problems were identified and treated appropriately. Home medication was restarted as needed.   She was evaluated each day by a clinical provider to ascertain the patient's response to treatment.  Improvement was noted by the patient's report of decreasing symptoms, improved sleep and appetite, affect, medication tolerance, behavior, and participation in unit programming.  The patient was asked each day to complete a self inventory noting mood, mental status, pain, new symptoms, anxiety and concerns.         She responded well to medication and being in a therapeutic and supportive environment. Positive and appropriate behavior was noted and the patient was motivated for recovery.  She worked closely with the treatment team and case manager to develop a discharge plan with appropriate goals. Coping skills, problem solving as well as relaxation therapies were also part of the unit programming. Patient expressed anxiety over not being started on medications such as Klonopin or xanax. Her medications were adjusted to target these symptoms. Patient had trouble understanding why medications such as stimulants or benzodiazepines were not in her best interest due to her history of alcohol abuse. Patient rated increased levels of anxiety over outside stressors.          By the day of discharge she was in much improved condition than upon admission.  Symptoms were reported as significantly decreased or resolved completely. The patient denied SI/HI and voiced no AVH. She was motivated to continue taking  medication with a goal of continued improvement in mental health.    Wallie Char was discharged home with a plan to follow up as noted below. The patient was provided with sample medications and prescriptions at time of discharge. He left [facility] in stable condition with all belongings returned to him. She planned to return home and requested a referral for another treatment provider for continued aftercare.   Consults:  None  Significant Diagnostic Studies:  Chemistry panel, CBC, UDS,   Discharge Vitals:   Blood pressure 109/80, pulse 101, temperature 99.2 F (37.3 C), temperature source Oral, resp. rate 17, height 5\' 1"  (1.549 m), weight 57.153 kg (126 lb), last menstrual period 03/19/2015. Body mass index is 23.82 kg/(m^2). Lab Results:   No results found for this or any previous visit (from the past 72 hour(s)).  Physical Findings: AIMS: Facial and Oral Movements Muscles of Facial Expression: None, normal Lips and Perioral Area: None, normal Jaw: None, normal Tongue: None, normal,Extremity Movements Upper (arms, wrists, hands, fingers): None, normal Lower (legs, knees, ankles, toes): None, normal, Trunk Movements Neck, shoulders, hips: None, normal, Overall Severity Severity of abnormal movements (highest score from questions above): None, normal Incapacitation due to abnormal movements: None, normal Patient's awareness of abnormal movements (rate only patient's report): No Awareness, Dental Status Current problems with teeth and/or dentures?: No Does patient usually wear dentures?: No  CIWA:  CIWA-Ar Total: 0 COWS:  COWS Total Score: 1   See Psychiatric Specialty Exam and Suicide Risk Assessment completed by Attending Physician prior to discharge.  Discharge destination:  Home  Is patient on multiple antipsychotic therapies at discharge:  No   Has Patient had three or more failed trials of antipsychotic monotherapy by history:  No  Recommended Plan for Multiple  Antipsychotic Therapies: NA     Medication List    STOP taking these medications        divalproex 500 MG 24 hr tablet  Commonly known as:  DEPAKOTE ER     MELATONIN PO      TAKE these medications      Indication   ARIPiprazole 5 MG tablet  Commonly known as:  ABILIFY  Take 1 tablet (5 mg total) by mouth daily.   Indication:  Mood control for Bipolar Disorder     doxepin 50 MG capsule  Commonly known as:  SINEQUAN  Take 1 capsule (50 mg total) by mouth at bedtime as needed (insomnia).   Indication:  Insomnia     gabapentin 100 MG capsule  Commonly known as:  NEURONTIN  Take 2 capsules (200 mg total) by mouth 3 (three) times daily.   Indication:  Agitation     lamoTRIgine 25  MG tablet  Commonly known as:  LAMICTAL  Take 1 tablet (25 mg total) by mouth daily.   Indication:  Manic-Depression     nicotine 21 mg/24hr patch  Commonly known as:  NICODERM CQ - dosed in mg/24 hours  Place 1 patch (21 mg total) onto the skin daily.   Indication:  Nicotine Addiction     prazosin 1 MG capsule  Commonly known as:  MINIPRESS  Take 1 capsule (1 mg total) by mouth at bedtime.   Indication:  Nightmares       Follow-up Information    Follow up with [facility] On 04/05/2015.   Why:  Appointment with counselor Kenyon Ana on Monday August 8th at 3pm who can get you established with medication management appointments with Dr. Lajean Silvius. Please call office if you need to reschedule.   Contact information:   [address],  [place], [place] [address] [phone]       Follow-up recommendations:   Activity: as tolerated Diet: regular Follow up [facility]  Comments:   Take all your medications as prescribed by your mental healthcare provider.  Report any adverse effects and or reactions from your medicines to your outpatient provider promptly.  Patient is instructed and cautioned to not engage in alcohol and or illegal drug use while on prescription medicines.  In the  event of worsening symptoms, patient is instructed to call the crisis hotline, 911 and or go to the nearest ED for appropriate evaluation and treatment of symptoms.  Follow-up with your primary care provider for your other medical issues, concerns and or health care needs.   Total Discharge Time: Greater than 30 minutes   Signed: DAVIS, LAURA NP-C 03/29/2015, 3:08 PM  I personally assessed the patient and formulated the plan Madie Reno A. Dub Mikes, M.D.

## 2015-05-07 ENCOUNTER — Ambulatory Visit (HOSPITAL_COMMUNITY): Payer: Self-pay | Admitting: Psychiatry

## 2015-06-16 ENCOUNTER — Other Ambulatory Visit: Payer: Self-pay | Admitting: Family Medicine

## 2015-06-16 DIAGNOSIS — R102 Pelvic and perineal pain: Secondary | ICD-10-CM

## 2015-06-21 ENCOUNTER — Ambulatory Visit
Admission: RE | Admit: 2015-06-21 | Discharge: 2015-06-21 | Disposition: A | Discharge status: Home / Self Care | Payer: Medicare Other | Source: Ambulatory Visit | Attending: Family Medicine | Admitting: Family Medicine

## 2015-06-21 DIAGNOSIS — R102 Pelvic and perineal pain: Secondary | ICD-10-CM

## 2015-06-21 IMAGING — US US TRANSVAGINAL NON-OB
1 series · 13 of 25 positions shown · non-contrast
Comparison: Pelvic ultrasound [DATE]

ADDENDUM:
Addendum for correction of impression.

Probable hemorrhagic cyst within the RIGHT ovary. Recommend
follow-up pelvic ultrasound 6- 8 weeks to assess for interval
resolution.
CLINICAL DATA: Patient with history of ovarian cysts. Multiple
pelvic infections. Painful menses.
EXAM:
TRANSABDOMINAL AND TRANSVAGINAL ULTRASOUND OF PELVIS
TECHNIQUE: Both transabdominal and transvaginal ultrasound examinations of the
pelvis were performed. Transabdominal technique was performed for
global imaging of the pelvis including uterus, ovaries, adnexal
regions, and pelvic cul-de-sac. It was necessary to proceed with
endovaginal exam following the transabdominal exam to visualize the
adnexal structures and endometrium.

[Series 1: us transvaginal non-ob · 0.22mm/px · 13 of 71 slices shown]
[im 1/71]
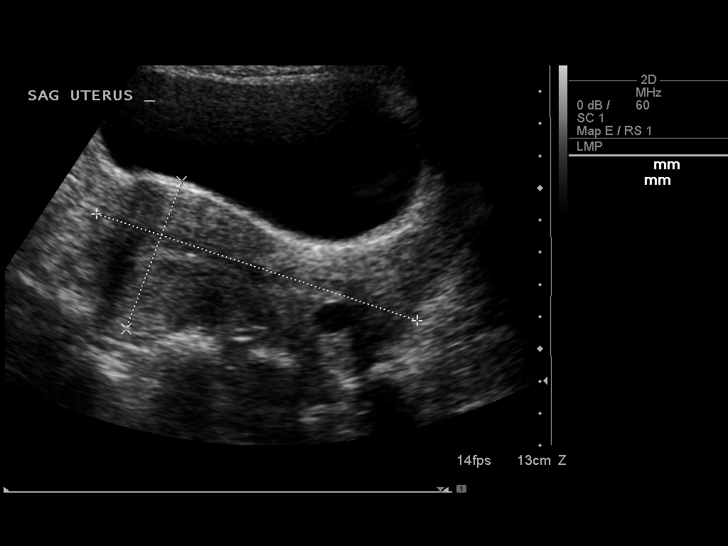
[im 6/71]
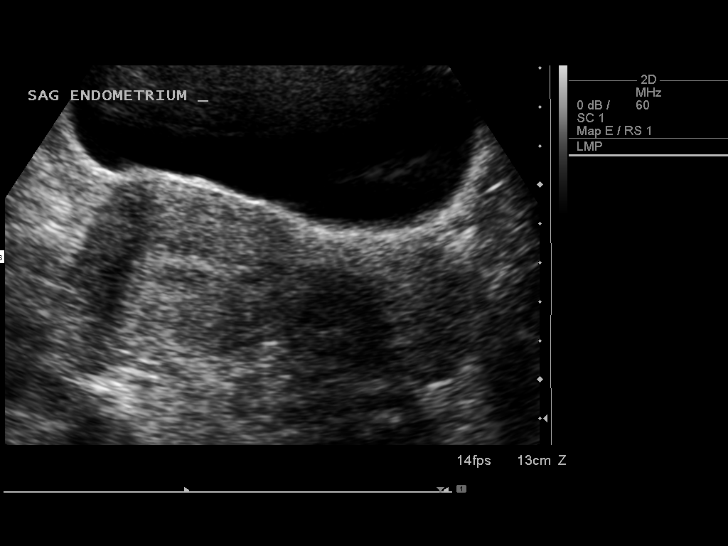
[im 12/71]
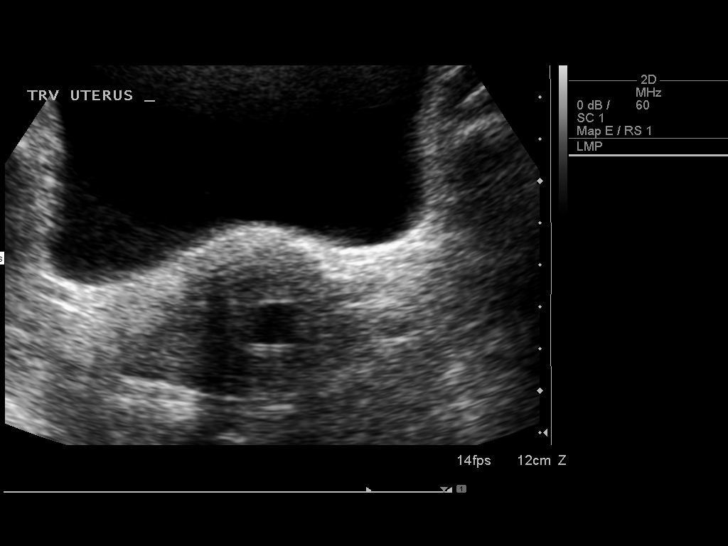
[im 18/71]
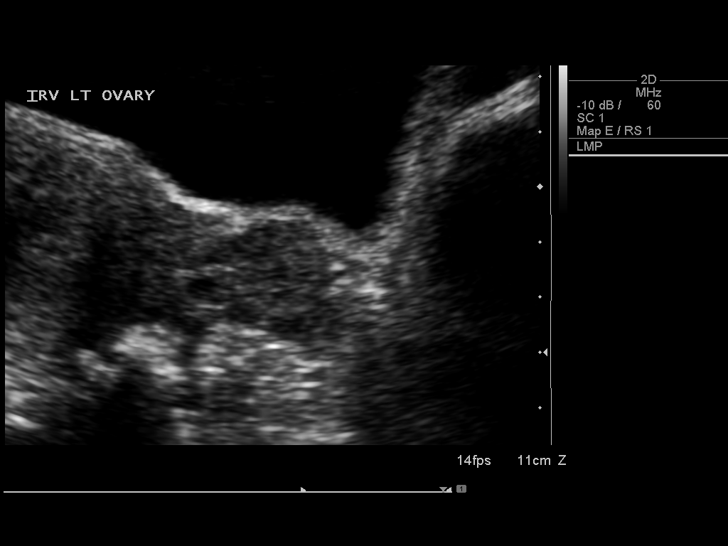
[im 24/71]
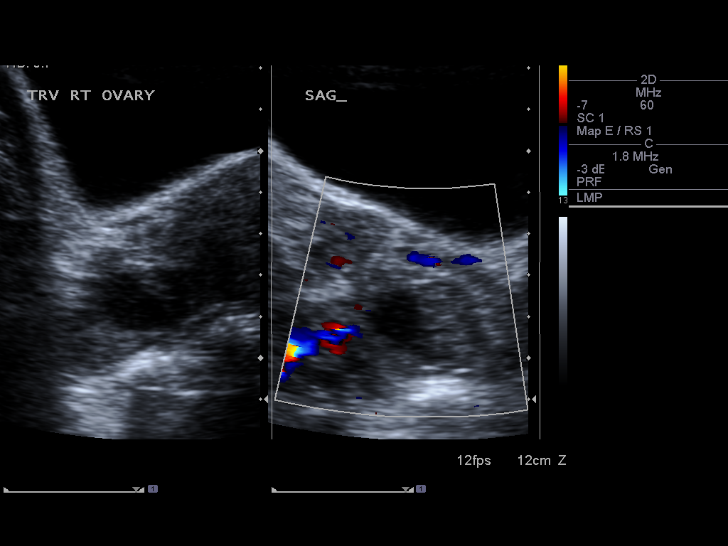
[im 30/71]
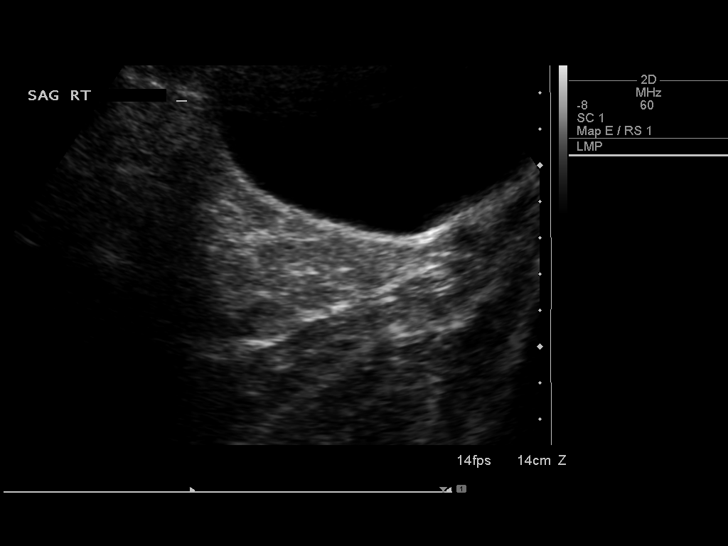
[im 36/71]
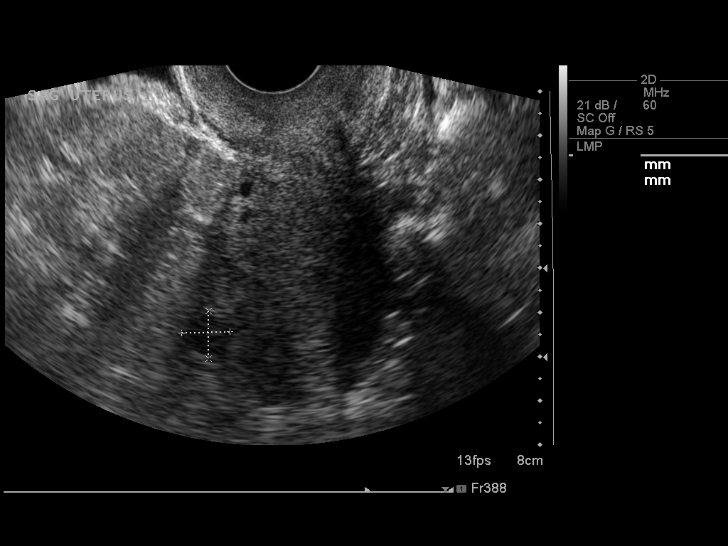
[im 41/71]
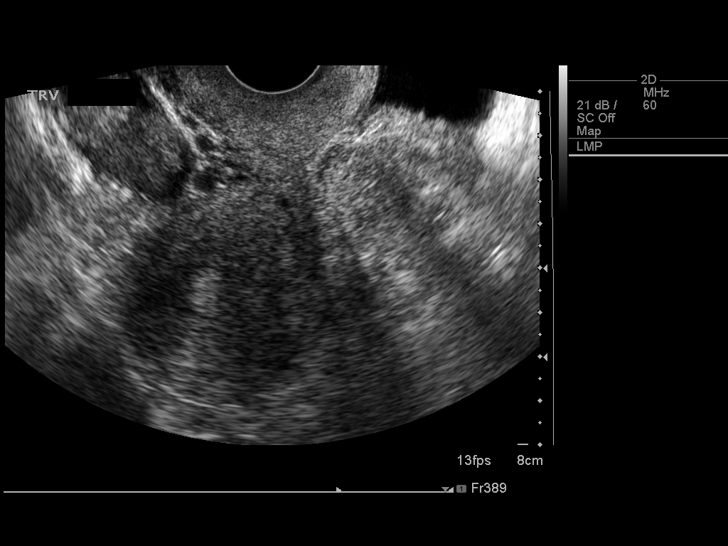
[im 47/71]
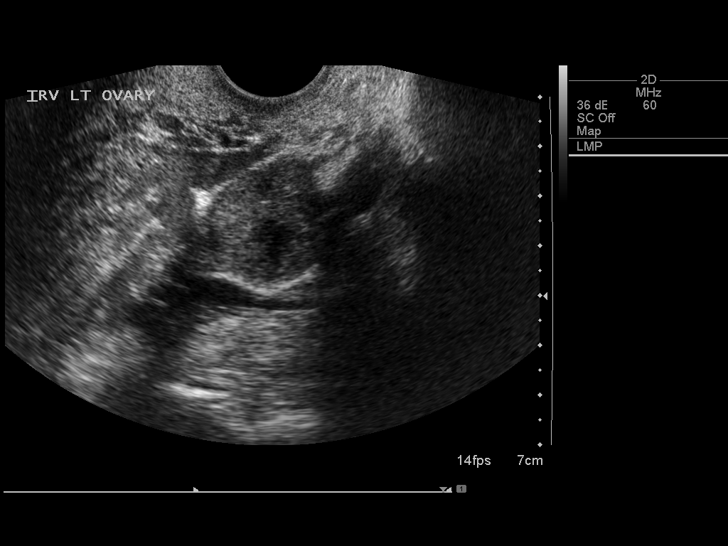
[im 53/71]
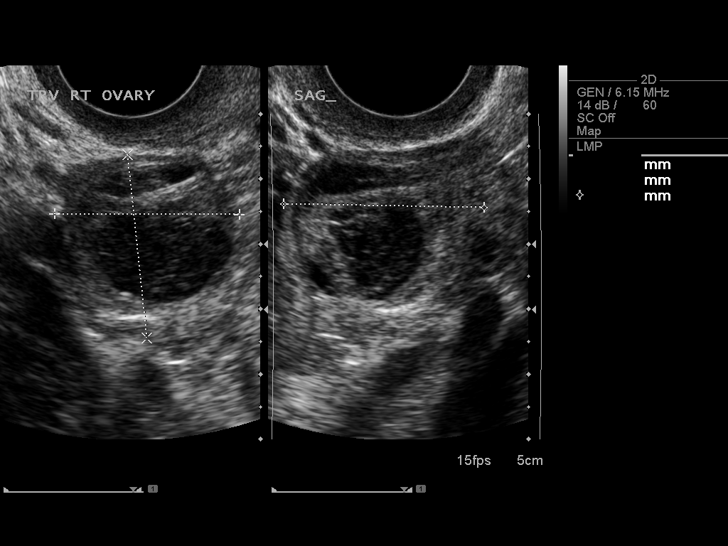
[im 59/71]
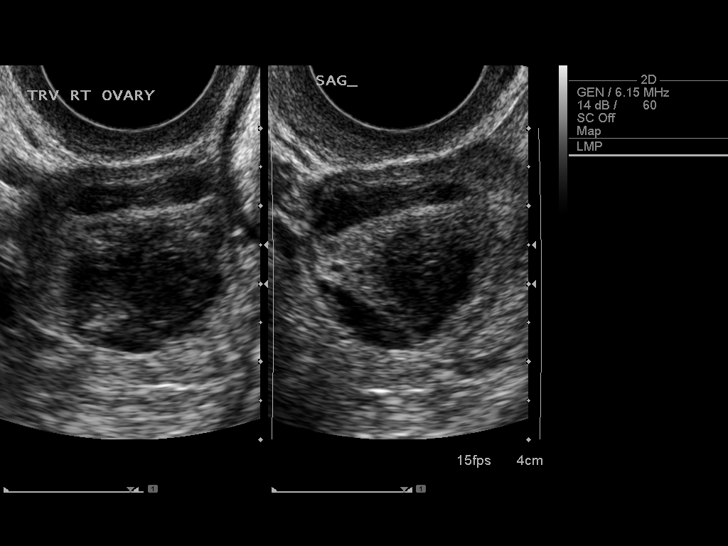
[im 65/71]
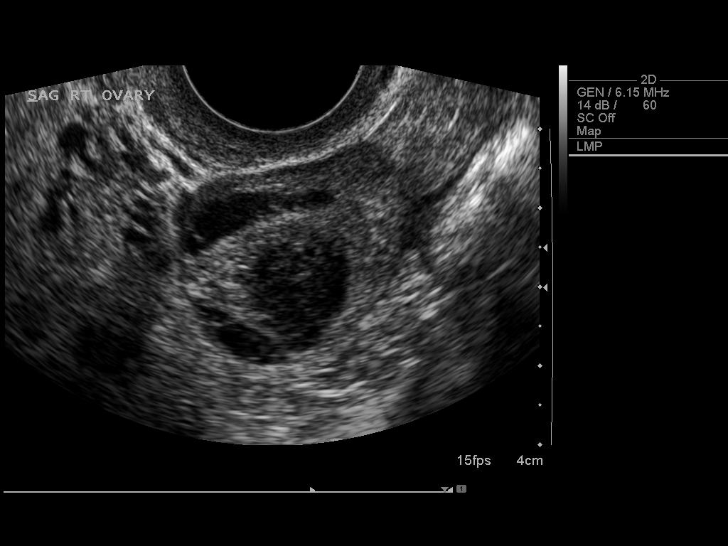
[im 71/71]
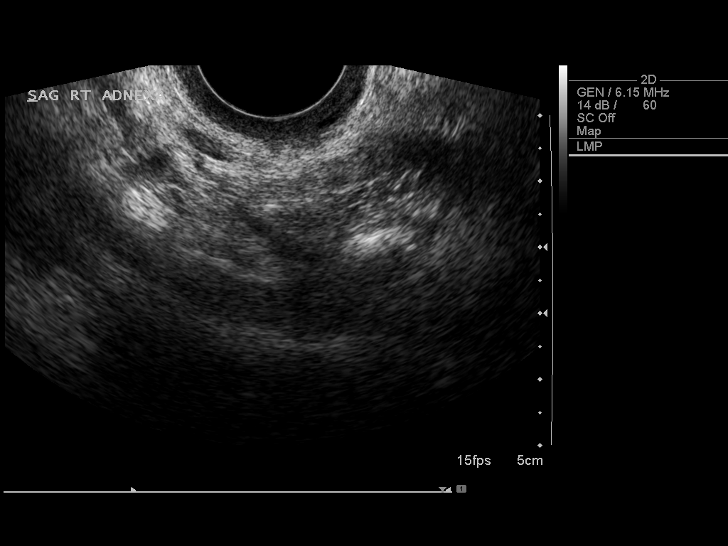

[13 of 25 positions shown; findings below may reference images not displayed]

FINDINGS: Uterus

Measurements: 10.5 x 4.9 x 5.1 cm. There is a 1.1 x 1.1 x 1.2 cm
intramural fibroid within the anterior uterine body.

Endometrium

Thickness: 6 mm.  No focal abnormality visualized.

Right ovary

Measurements: 2.8 x 2.8 x 3.6 cm. There is a 2.3 x 2.3 x 2.3 cm
complicated cystic structure within right ovary with internal
echoes.

Left ovary

Measurements: 2.7 x 2.1 x 4.0 cm. Normal appearance/no adnexal mass.

Other findings

Trace free fluid in the pelvis.
IMPRESSION: No acute process involving the pelvis.

Probable hemorrhagic cyst within the left ovary. Recommend follow-up
pelvic ultrasound in 6- 8 weeks to assess for interval resolution.

## 2015-07-05 ENCOUNTER — Other Ambulatory Visit: Payer: Self-pay | Admitting: Obstetrics & Gynecology

## 2015-09-27 ENCOUNTER — Telehealth: Payer: Self-pay | Admitting: Obstetrics & Gynecology

## 2015-09-27 NOTE — Telephone Encounter (Signed)
Patient is calling to schedule a Depo injection. I do not see a past Depo injection. Patient feels that she did have Depo in the past. Patient is aware a triage nurse will need to call her back.

## 2015-09-27 NOTE — Telephone Encounter (Signed)
Left message to call Bernarda Erck at [phone]. 

## 2015-10-05 NOTE — Telephone Encounter (Signed)
Follow-up call to patient, left message to call back, ask for triage nurse.

## 2015-10-12 NOTE — Telephone Encounter (Signed)
Dr.Miller, Kennon Rounds and I have attempted to reach this patient x2 with no return call. Okay to close encounter?

## 2015-10-13 NOTE — Telephone Encounter (Signed)
Yes, ok to close encounter. 

## 2015-10-18 ENCOUNTER — Ambulatory Visit (INDEPENDENT_AMBULATORY_CARE_PROVIDER_SITE_OTHER): Payer: Commercial Managed Care - PPO | Admitting: Podiatry

## 2015-10-18 ENCOUNTER — Encounter: Payer: Self-pay | Admitting: Podiatry

## 2015-10-18 VITALS — BP 155/101 | HR 100 | Resp 12

## 2015-10-18 DIAGNOSIS — B079 Viral wart, unspecified: Secondary | ICD-10-CM

## 2015-10-18 DIAGNOSIS — B070 Plantar wart: Secondary | ICD-10-CM

## 2015-10-18 MED ORDER — FLUOROURACIL 5 % EX CREA: TOPICAL_CREAM | Freq: Two times a day (BID) | CUTANEOUS | Status: DC

## 2015-10-18 NOTE — Progress Notes (Signed)
   Subjective:    Patient ID: Carolyn Jennings, female    DOB: 1971-01-04, 45 y.o.   MRN: [id number]  HPI: She presents today with many month duration of warts to the plantar aspect of her fifth toe and plantar lateral foot. She states that she has tried having them frozen which did not work.    Review of Systems  Skin: Positive for color change.       Objective:   Physical Exam:vital signs are stable she is alert and oriented 3 ulcers are palpable. Neurologic sensorium is intact. Deep tendon reflexes are intact. Muscle strength is 5 over 5 dorsiflexion plantar flexors and inverters everters all intrinsic musculature is intact. Orthopedic evaluations resulting assistant for range of motion without crepitation. Cutaneous evaluation does demonstrate colony of warts beneath the fifth toe within the sulcus. Many small satellite lesions too numerous to count.        Assessment & Plan:  Verruca vulgaris aspect left foot.  Plan: Place acid on the lesions today under occlusion to be washed off thoroughly tomorrow morning she will also start Efudex cream which was prescribed today twice daily and cover with a Band-Aid. I will follow-up with her in 6 weeks.

## 2015-11-04 ENCOUNTER — Other Ambulatory Visit (HOSPITAL_COMMUNITY): Payer: Self-pay | Admitting: Psychiatry

## 2015-11-29 ENCOUNTER — Ambulatory Visit (INDEPENDENT_AMBULATORY_CARE_PROVIDER_SITE_OTHER): Payer: Commercial Managed Care - PPO | Admitting: Podiatry

## 2015-11-29 ENCOUNTER — Encounter: Payer: Self-pay | Admitting: Podiatry

## 2015-11-29 VITALS — BP 141/92 | HR 68 | Resp 12

## 2015-11-29 DIAGNOSIS — B079 Viral wart, unspecified: Secondary | ICD-10-CM | POA: Diagnosis not present

## 2015-11-29 MED ORDER — FLUOROURACIL 5 % EX CREA: TOPICAL_CREAM | Freq: Two times a day (BID) | CUTANEOUS | Status: DC

## 2015-11-29 NOTE — Progress Notes (Signed)
She presents today for warts to the plantar aspect of the left foot. She states that things are going away but are not sure. She continues her Efudex cream.  Objective: Vital signs are stable she is alert and oriented 3. Pulses are palpable. Warts to the plantar aspect of the fourth webspace left foot appear to be resolving slowly.  Assessment: Plantar warts lateral aspect of the left foot.  Plan: Refill her Efudex and will follow up with her in 6 weeks. If she's no better we will consider laser therapy.

## 2016-01-31 ENCOUNTER — Ambulatory Visit: Payer: Medicare Other | Admitting: Podiatry

## 2016-02-07 ENCOUNTER — Ambulatory Visit: Payer: Medicare Other | Admitting: Podiatry

## 2016-02-21 ENCOUNTER — Encounter: Payer: Self-pay | Admitting: Podiatry

## 2016-02-21 ENCOUNTER — Ambulatory Visit (INDEPENDENT_AMBULATORY_CARE_PROVIDER_SITE_OTHER): Payer: Commercial Managed Care - PPO | Admitting: Podiatry

## 2016-02-21 DIAGNOSIS — B079 Viral wart, unspecified: Secondary | ICD-10-CM

## 2016-02-21 NOTE — Progress Notes (Signed)
She presents today for surgical consult regarding warts to the plantar lateral aspect of her left foot. She presents with her husband today.  Objective: Vital signs are stable she is alert and oriented 3. Pulses are palpable. Neurologic sensorium is intact. Deep tendon reflexes are intact. Warts appeared to not be resolving at all to the left foot.  Assessment: Warts plantar aspect left foot recalcitrant to Efudex.  Plan: We discussed the etiology pathology conservative versus surgical therapies. At this point I have recommended laser assisted excision warts plantar aspect of the left foot. She understands this and is amenable to it. We discussed possible postop complications which may include are not limited to postop pain bleeding swelling infection recurrence need for further surgery. Follow-up with me in the near future for surgical intervention.

## 2016-02-21 NOTE — Patient Instructions (Signed)

## 2016-02-23 ENCOUNTER — Telehealth: Payer: Self-pay | Admitting: *Deleted

## 2016-02-23 NOTE — Telephone Encounter (Signed)
"  I'm her husband.  We were having problems with the service being filed under the wrong insurance but I got that straightened out.  I just want to make sure the insurance will cover her laser surgery."  What insurances does she have?  "She has 2, [employer] and [employer].  They should cover the surgery.  Would you like to go ahead and schedule her surgery?  "We were under the impression someone would call her."  I'm the one that does the scheduling.  "I guess schedule it for July 7th."  He doesn't have anything available that day.  He's not doing surgeries on July 14.  He can do it July 21.  "That day will be fine."

## 2016-02-25 ENCOUNTER — Telehealth: Payer: Self-pay | Admitting: *Deleted

## 2016-02-25 NOTE — Telephone Encounter (Signed)
"  This is Carolyn Jennings's husband, Carolyn Jennings.  She's been pre-scheduled for surgery on the 21st.  She needs to know the information as to where to be, when to be and any particulars she needs to address to prep for surgery.

## 2016-02-28 NOTE — Telephone Encounter (Signed)
I called and left her a message that I was returning her husband's call.  [facility].  You will need to register with the surgical center, instructions are in your surgical pamphlet.  You will need to register with the surgical center.  Instructions to do registration are in your pamphlet.  Someone from the surgical center will call you a day or 2 prior to surgery.  They will give you the arrival time.  Do not eat or drink anything after midnight the night before.  Call if you have any further questions.

## 2016-03-06 ENCOUNTER — Telehealth: Payer: Self-pay | Admitting: *Deleted

## 2016-03-07 NOTE — Telephone Encounter (Signed)
"  I'm scheduled for surgery with Dr. Al CorpusHyatt on 03/17/2016.  I'm calling to reschedule."

## 2016-03-07 NOTE — Telephone Encounter (Signed)
I attempted to return her call.  I left her a message to call me back. 

## 2016-03-10 NOTE — Telephone Encounter (Signed)
I left patient a message that I would cancel appointment for July 21.  Call me if you would like to reschedule.

## 2016-03-22 ENCOUNTER — Encounter: Payer: Self-pay | Admitting: Podiatry

## 2016-04-04 ENCOUNTER — Telehealth: Payer: Self-pay | Admitting: Podiatry

## 2016-04-04 NOTE — Telephone Encounter (Signed)
Patient called said she wants to talk to a nurse about what her options are for getting rid of this plantars wart on her left foot? She said surgery and being put to sleep for this is not an option. She wants to know if there is another medication stronger to remove this wart under her pinky toe other than being put to sleep. Please call her. Thanks !

## 2016-04-05 NOTE — Telephone Encounter (Signed)
I spoke with pt, gave the options of Canthacur or surgery in the office. Pt states Dr. Al CorpusHyatt has only done one thing at her visits and he prescribed her Efudex, when he should have prescribed with another cream. I told pt she should make an appt to discuss with Dr. Al CorpusHyatt, and to see wart in it's current state. Pt states this is costing her money and she doesn't want to come in again just for a wart on her pinkie toe. Pt's phone had a distinctive lag time in response to her answers to me, making it difficult to talk with the pt, I would either talk over her voice or she would talk over mine.

## 2016-04-24 ENCOUNTER — Encounter: Payer: Self-pay | Admitting: Podiatry

## 2016-04-24 ENCOUNTER — Ambulatory Visit (INDEPENDENT_AMBULATORY_CARE_PROVIDER_SITE_OTHER): Payer: Commercial Managed Care - PPO | Admitting: Podiatry

## 2016-04-24 DIAGNOSIS — B079 Viral wart, unspecified: Secondary | ICD-10-CM

## 2016-04-24 DIAGNOSIS — B070 Plantar wart: Secondary | ICD-10-CM

## 2016-04-24 NOTE — Progress Notes (Signed)
She presents today chief complaint warts plantar aspect of the left foot. She states that she continues that he dates cream regularly.  Objective: Vital signs are stable she is alert and oriented 3. Pulses are palpable. Verrucoid lesions plantar aspect of the fourth and fifth digits of the left foot with the web interspace as well.  Assessment: Pain and limb secondary to verruca plantaris left.  Plan: Chemical destruction with Cantharone today to be left under occlusion until tomorrow and washed out thoroughly. She will then continue the Efudex cream twice daily will follow up with me in 6 weeks.

## 2016-05-02 ENCOUNTER — Telehealth: Payer: Self-pay | Admitting: Podiatry
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
## 2016-06-05 ENCOUNTER — Ambulatory Visit (INDEPENDENT_AMBULATORY_CARE_PROVIDER_SITE_OTHER): Payer: Commercial Managed Care - PPO | Admitting: Podiatry

## 2016-06-05 ENCOUNTER — Encounter: Payer: Self-pay | Admitting: Podiatry

## 2016-06-05 DIAGNOSIS — B070 Plantar wart: Secondary | ICD-10-CM

## 2016-06-06 NOTE — Progress Notes (Signed)
She presents today for follow-up of warts to the plantar aspect of her fourth and fifth digits of the left foot. She states that after the last chemical application they blistered and were very painful. She states however it is doing much better.  Objective: Vital signs are stable alert and oriented 3. Pulses are palpable. Warts remain present appear to be much more superficially debrided and bleeding today  Assessment: Verruca plantaris fourth digits left foot.  Plan: Re application of Cantharone today and she will wash this off thoroughly tomorrow. I'll follow-up with her in another 6 weeks.

## 2016-07-24 ENCOUNTER — Ambulatory Visit (INDEPENDENT_AMBULATORY_CARE_PROVIDER_SITE_OTHER): Payer: Commercial Managed Care - PPO | Admitting: Podiatry

## 2016-07-24 ENCOUNTER — Encounter: Payer: Self-pay | Admitting: Podiatry

## 2016-07-24 DIAGNOSIS — B070 Plantar wart: Secondary | ICD-10-CM

## 2016-07-24 MED ORDER — FLUOROURACIL 5 % EX CREA: TOPICAL_CREAM | Freq: Two times a day (BID) | CUTANEOUS | 0 refills | Status: DC

## 2016-07-25 NOTE — Progress Notes (Signed)
She presents today for follow-up of her warts plantar aspect of the left foot. She states that things are getting some better but I think he may have one of my toe and she points to the hallux left.  Objective: Vital signs are stable alert and oriented 3 pulses are palpable. Verrucoid lesions plantar aspect and in between the fourth and fifth digits of the left foot appear to be resolving well C no lesion on the left hallux however she does have a lesion plantar aspect of the first metatarsal right foot.  Assessment: Verruca plantaris bilateral.  Plan: Applied Cantharone today under occlusion she will start back on her Efudex which I refilled.

## 2016-09-06 ENCOUNTER — Ambulatory Visit (INDEPENDENT_AMBULATORY_CARE_PROVIDER_SITE_OTHER): Payer: Commercial Managed Care - PPO | Admitting: Podiatry

## 2016-09-06 DIAGNOSIS — B070 Plantar wart: Secondary | ICD-10-CM

## 2016-09-06 NOTE — Progress Notes (Signed)
She presents today for follow-up of her warts left foot. She states they're still painful she refers to the fifth toe.  Objective: Multiple verrucoid lesions are still present however much decreased in size and depth from previous evaluations. She does not want be put to sleep for laser therapy.  Assessment: Verruca plantaris left foot.  Plan: Chemical destruction of lesions follow up with her in 2 months

## 2016-11-06 ENCOUNTER — Ambulatory Visit (INDEPENDENT_AMBULATORY_CARE_PROVIDER_SITE_OTHER): Payer: Commercial Managed Care - PPO | Admitting: Podiatry

## 2016-11-06 ENCOUNTER — Encounter: Payer: Self-pay | Admitting: Podiatry

## 2016-11-06 DIAGNOSIS — B070 Plantar wart: Secondary | ICD-10-CM

## 2016-11-06 NOTE — Progress Notes (Signed)
She presents today for follow-up of warts to the plantar aspect of her left and right foot. He states they seem to be doing better.  Objective: Vital signs are stable she's alert and oriented 3 no erythema edema cellulitis drainage or odor. Pulses are palpable. Small verrucoid lesions plantar aspect of the fourth and fifth toes of the left foot and fourth and fifth metatarsal areas of the left foot. Right foot demonstrate a small verrucoid lesion sub-first metatarsophalangeal joint.  Assessment: Well healing verrucoid lesions.  Plan: Chemical destruction of these lesions today we'll follow-up with her in 6-8 weeks.

## 2017-01-08 ENCOUNTER — Encounter (INDEPENDENT_AMBULATORY_CARE_PROVIDER_SITE_OTHER): Payer: Medicare Other | Admitting: Podiatry

## 2017-01-08 NOTE — Progress Notes (Signed)
This encounter was created in error - please disregard.

## 2017-02-16 IMAGING — MG MM DIAGNOSTIC UNILATERAL L
2 series · 2 of 2 positions shown · non-contrast
Comparison: Previous exam(s).

CLINICAL DATA: Ultrasound-guided biopsies performed of a complex
cystic mass in the [DATE] position of the left breast.

EXAM:
DIAGNOSTIC LEFT MAMMOGRAM POST ULTRASOUND BIOPSY

[L CC]
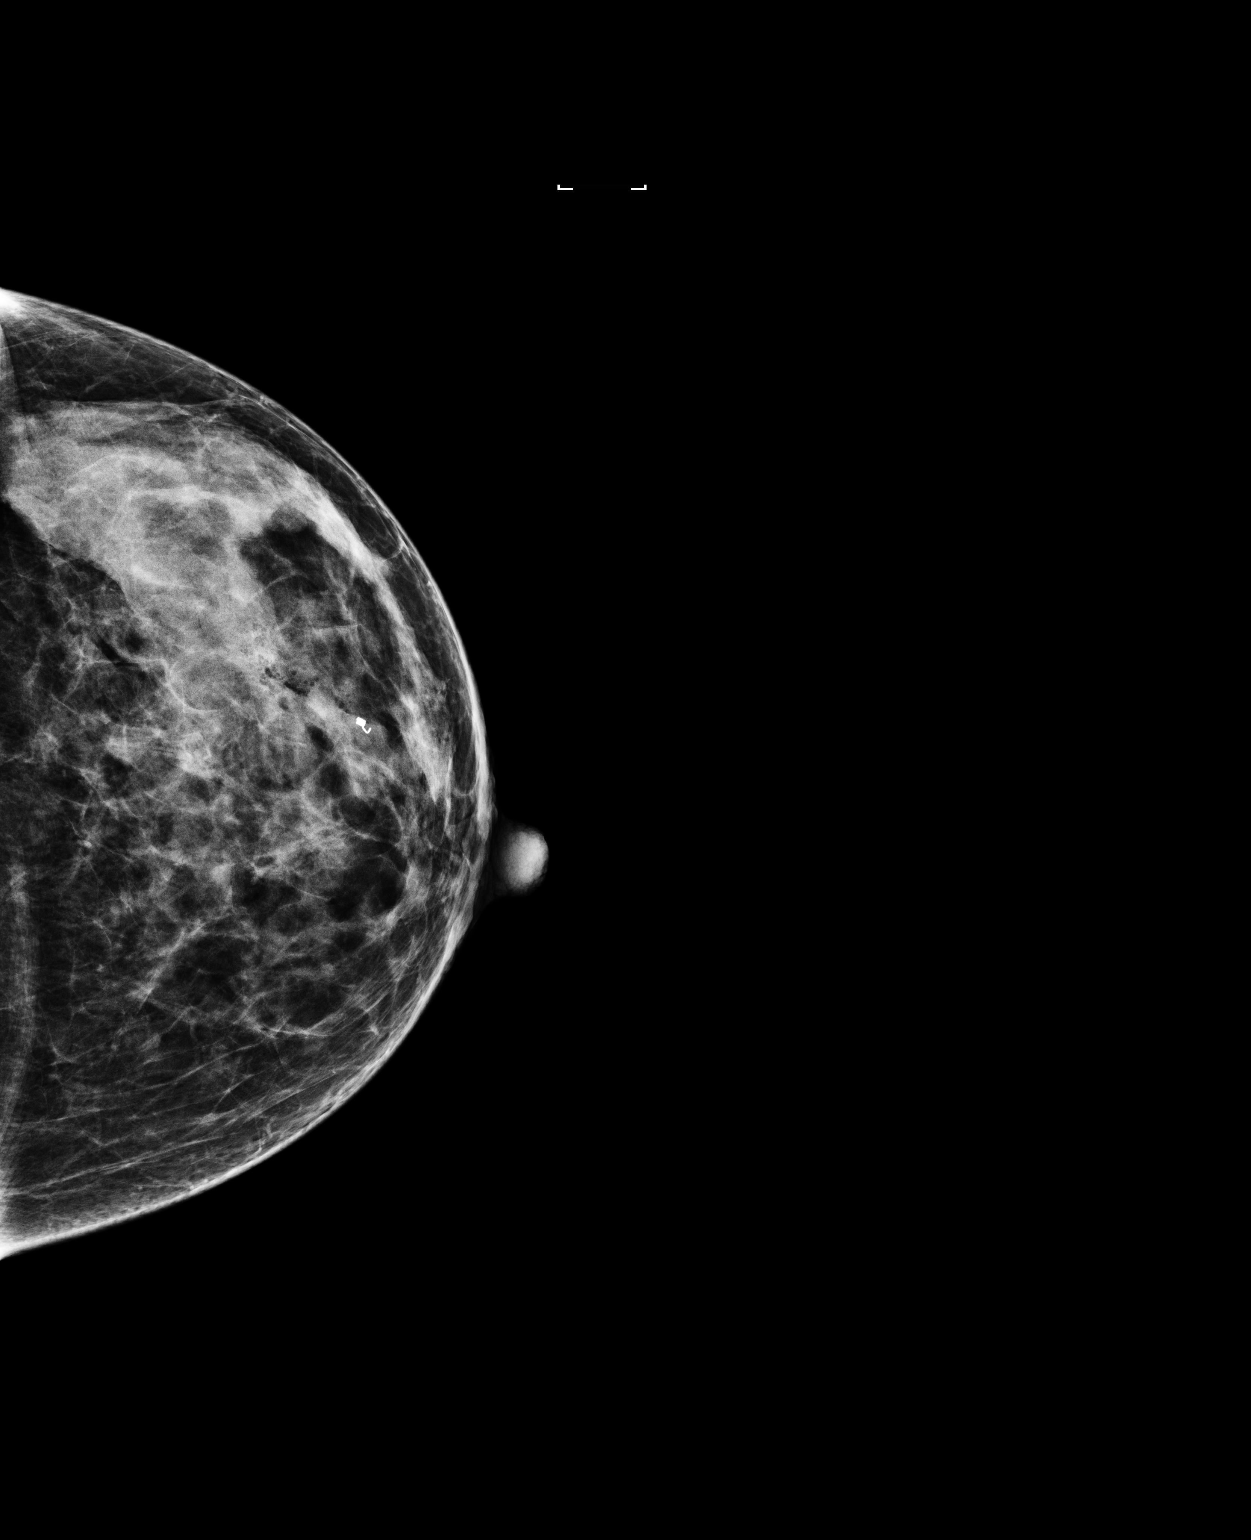

[L ML]
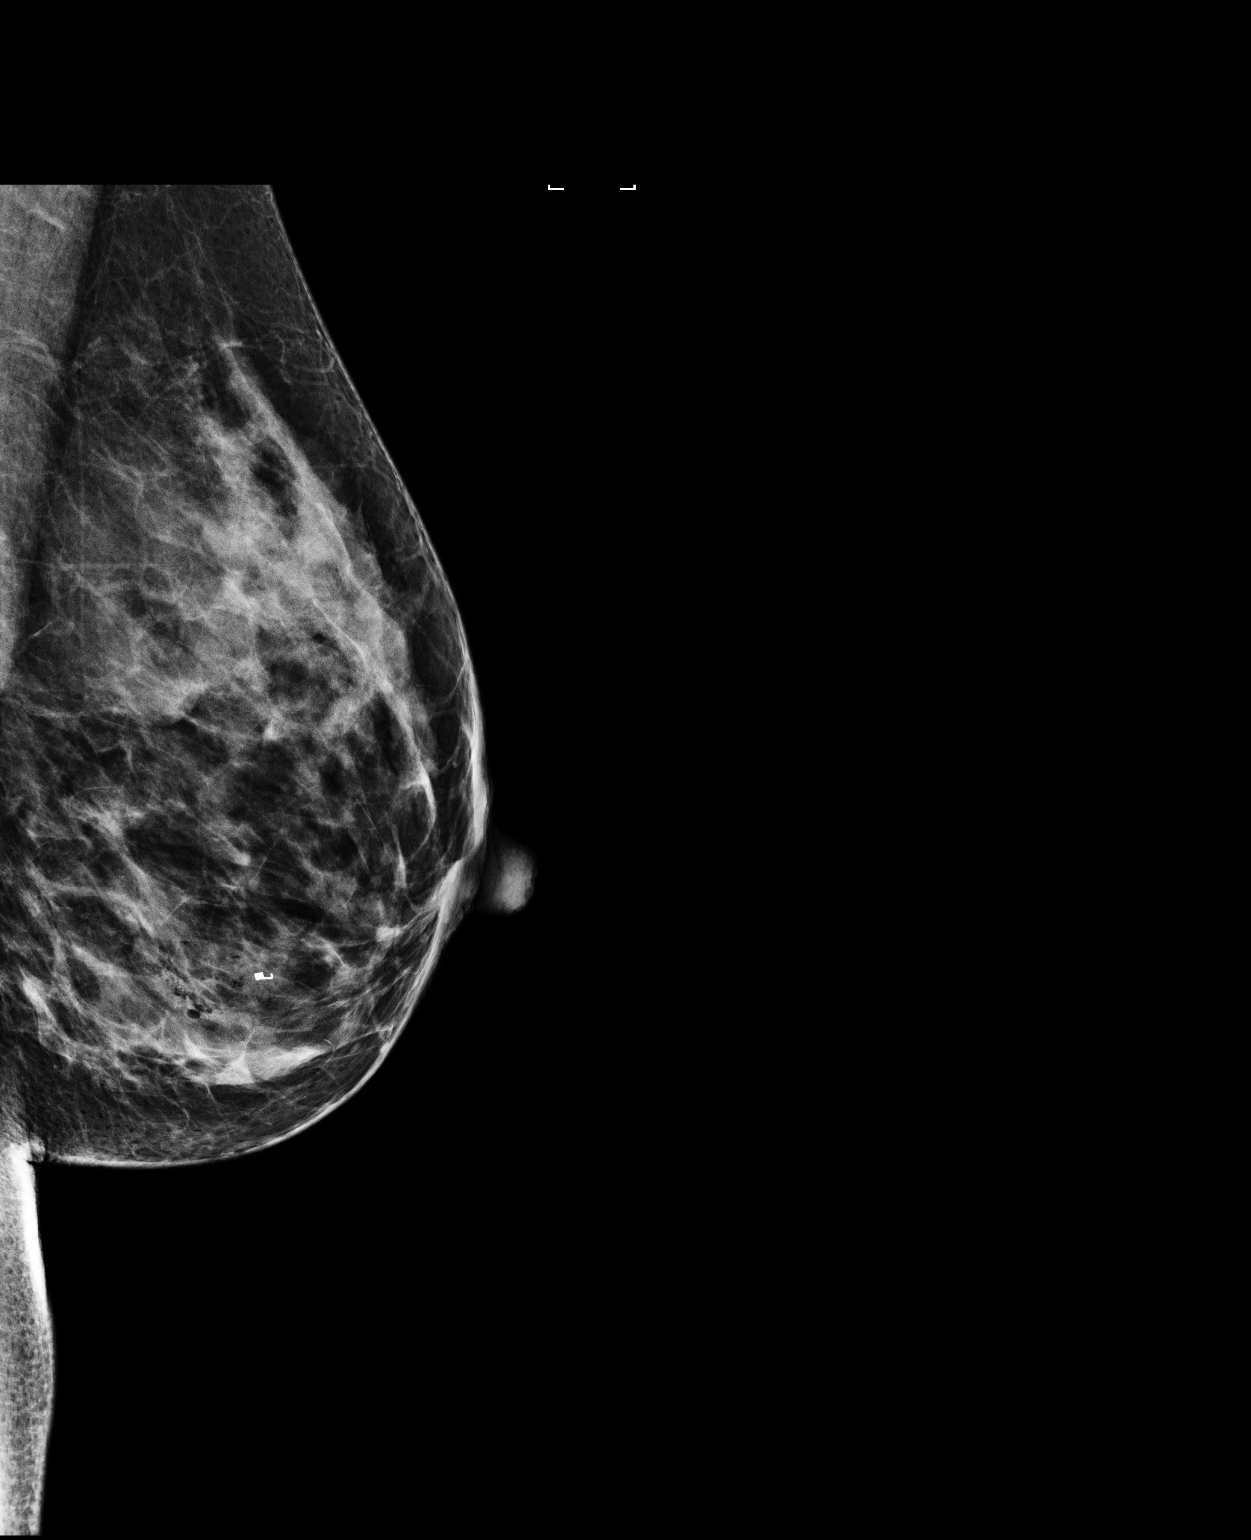

[2 of 2 positions shown; findings below may reference images not displayed]

FINDINGS: Mammographic images were obtained following ultrasound guided biopsy
of complex cystic mass [DATE] position left breast. A coil shaped
biopsy clip is satisfactorily positioned in the expected location of
the mass.
IMPRESSION: Satisfactory position of biopsy clip in the lower outer left breast.

Final Assessment: Post Procedure Mammograms for Marker Placement

## 2017-05-21 ENCOUNTER — Emergency Department (HOSPITAL_COMMUNITY)
Admission: EM | Admit: 2017-05-21 | Discharge: 2017-05-25 | Disposition: A | Discharge status: Home / Self Care | Payer: Medicare Other | Attending: Emergency Medicine | Admitting: Emergency Medicine

## 2017-05-21 ENCOUNTER — Encounter (HOSPITAL_COMMUNITY): Payer: Self-pay | Admitting: *Deleted

## 2017-05-21 DIAGNOSIS — F17210 Nicotine dependence, cigarettes, uncomplicated: Secondary | ICD-10-CM | POA: Diagnosis not present

## 2017-05-21 DIAGNOSIS — Z79899 Other long term (current) drug therapy: Secondary | ICD-10-CM | POA: Insufficient documentation

## 2017-05-21 DIAGNOSIS — F419 Anxiety disorder, unspecified: Secondary | ICD-10-CM | POA: Diagnosis not present

## 2017-05-21 DIAGNOSIS — F900 Attention-deficit hyperactivity disorder, predominantly inattentive type: Secondary | ICD-10-CM | POA: Diagnosis not present

## 2017-05-21 DIAGNOSIS — F22 Delusional disorders: Secondary | ICD-10-CM | POA: Diagnosis not present

## 2017-05-21 DIAGNOSIS — F3131 Bipolar disorder, current episode depressed, mild: Secondary | ICD-10-CM

## 2017-05-21 DIAGNOSIS — F312 Bipolar disorder, current episode manic severe with psychotic features: Secondary | ICD-10-CM | POA: Diagnosis present

## 2017-05-21 DIAGNOSIS — R451 Restlessness and agitation: Secondary | ICD-10-CM | POA: Diagnosis present

## 2017-05-21 LAB — CBC
HCT: 36.7 % (ref 36.0–46.0)
Hemoglobin: 12.9 g/dL (ref 12.0–15.0)
MCH: 31.9 pg (ref 26.0–34.0)
MCHC: 35.1 g/dL (ref 30.0–36.0)
MCV: 90.8 fL (ref 78.0–100.0)
Platelets: 324 10*3/uL (ref 150–400)
RBC: 4.04 MIL/uL (ref 3.87–5.11)
RDW: 13.7 % (ref 11.5–15.5)
WBC: 13.8 10*3/uL — ABNORMAL HIGH (ref 4.0–10.5)

## 2017-05-21 LAB — COMPREHENSIVE METABOLIC PANEL
ALT: 15 U/L (ref 14–54)
AST: 18 U/L (ref 15–41)
Albumin: 4.2 g/dL (ref 3.5–5.0)
Alkaline Phosphatase: 75 U/L (ref 38–126)
Anion gap: 10 (ref 5–15)
BUN: 9 mg/dL (ref 6–20)
CHLORIDE: 105 mmol/L (ref 101–111)
CO2: 24 mmol/L (ref 22–32)
Calcium: 9 mg/dL (ref 8.9–10.3)
Creatinine, Ser: 0.73 mg/dL (ref 0.44–1.00)
GFR calc Af Amer: >60 mL/min (ref >60)
Glucose, Bld: 96 mg/dL (ref 65–99)
POTASSIUM: 3.5 mmol/L (ref 3.5–5.1)
Sodium: 139 mmol/L (ref 135–145)
Total Bilirubin: 0.7 mg/dL (ref 0.3–1.2)
Total Protein: 7.2 g/dL (ref 6.5–8.1)

## 2017-05-21 LAB — ETHANOL

## 2017-05-21 MED ORDER — STERILE WATER FOR INJECTION IJ SOLN
INTRAMUSCULAR | Status: AC
  Filled 2017-05-21: qty 10

## 2017-05-21 MED ORDER — ZIPRASIDONE MESYLATE 20 MG IM SOLR
10.0000 mg | Freq: Once | INTRAMUSCULAR | Status: AC
  Administered 2017-05-21: 10 mg via INTRAMUSCULAR
  Filled 2017-05-21: qty 20

## 2017-05-21 MED ORDER — ZIPRASIDONE MESYLATE 20 MG IM SOLR
10.0000 mg | Freq: Once | INTRAMUSCULAR | Status: AC
  Administered 2017-05-23: 10 mg via INTRAMUSCULAR
  Filled 2017-05-21 (×2): qty 20

## 2017-05-21 MED ORDER — DIPHENHYDRAMINE HCL 50 MG/ML IJ SOLN
INTRAMUSCULAR | Status: AC
  Filled 2017-05-21: qty 1
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
## 2017-05-22 DIAGNOSIS — F312 Bipolar disorder, current episode manic severe with psychotic features: Secondary | ICD-10-CM | POA: Diagnosis present

## 2017-05-22 LAB — RAPID URINE DRUG SCREEN, HOSP PERFORMED
Amphetamines: POSITIVE — AB
BARBITURATES: NOT DETECTED
Benzodiazepines: POSITIVE — AB
Cocaine: NOT DETECTED
OPIATES: NOT DETECTED
TETRAHYDROCANNABINOL: NOT DETECTED

## 2017-05-22 MED ORDER — LORAZEPAM 2 MG/ML IJ SOLN
1.0000 mg | Freq: Once | INTRAMUSCULAR | Status: AC
  Administered 2017-05-22: 1 mg via INTRAMUSCULAR
  Filled 2017-05-22: qty 1

## 2017-05-22 MED ORDER — GABAPENTIN 300 MG PO CAPS
300.0000 mg | ORAL_CAPSULE | Freq: Two times a day (BID) | ORAL | Status: DC
  Filled 2017-05-22 (×3): qty 1

## 2017-05-22 MED ORDER — RISPERIDONE 1 MG PO TABS
1.0000 mg | ORAL_TABLET | Freq: Two times a day (BID) | ORAL | Status: DC
  Administered 2017-05-22 – 2017-05-24 (×4): 1 mg via ORAL
  Filled 2017-05-22 (×5): qty 1

## 2017-05-22 MED ORDER — ZIPRASIDONE MESYLATE 20 MG IM SOLR
10.0000 mg | Freq: Once | INTRAMUSCULAR | Status: AC
  Administered 2017-05-22: 10 mg via INTRAMUSCULAR
  Filled 2017-05-22: qty 20

## 2017-05-22 MED ORDER — STERILE WATER FOR INJECTION IJ SOLN
INTRAMUSCULAR | Status: AC
  Administered 2017-05-22: 06:00:00 via INTRAMUSCULAR
  Filled 2017-05-22: qty 10

## 2017-05-22 MED ORDER — LORAZEPAM 2 MG/ML IJ SOLN
1.0000 mg | INTRAMUSCULAR | Status: DC | PRN
Start: 2017-05-22 — End: 2017-05-23
  Administered 2017-05-22: 1 mg via INTRAMUSCULAR
  Filled 2017-05-22: qty 1

## 2017-05-22 MED ORDER — DIPHENHYDRAMINE HCL 50 MG/ML IJ SOLN
50.0000 mg | Freq: Once | INTRAMUSCULAR | Status: AC
  Administered 2017-05-22: 50 mg via INTRAMUSCULAR
  Filled 2017-05-22: qty 1

## 2017-05-22 NOTE — ED Provider Notes (Signed)
Patient is re-evaluated as per plan of previous treatment team. She had received Geodon for agitation and was unable to be evaluated by TTS. At 3:00 she is easily awakened, more calm. She is felt stable and appropriate for consultation to determine disposition. She did not express any needs.    Elpidio Anis, PA-C 05/22/17 5621    Shaune Pollack, MD 05/22/17 [id number]
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
## 2017-05-22 NOTE — ED Notes (Signed)
Patient seen sleeping at this time. Respiration even and unlabored. No distress noted. Will continue to monitor patient. 

## 2017-05-22 NOTE — Progress Notes (Signed)
05/22/17 1359:  LRT went to pt room to offer activities, pt was sleep.  Caroll Rancher, LRT/CTRS

## 2017-05-22 NOTE — ED Notes (Signed)
Pt becoming increasingly agitated, anxious, pt believes the television is talking to her. Encouragement and support provided. Will continue to monitor.
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
## 2017-05-22 NOTE — ED Notes (Signed)
Pt has been sleeping this shift up to this point. Pt is now awake. Pt given specimen cup and encourged to provide urine per MD order. Pt tangential on approach. Pt delusional,paranoid- pt referencing the [employer], Trump, and the department of defense.  Pt denies SI, Encouragement and support provided. Special checks q 15 mins in place for safety, Video monitoring in place. Will continue to monitor.

## 2017-05-22 NOTE — ED Notes (Signed)
Pt changed into paper scrubs and belongings removed and placed at nursing station. Pt wanded by security.
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
## 2017-05-23 DIAGNOSIS — F17210 Nicotine dependence, cigarettes, uncomplicated: Secondary | ICD-10-CM

## 2017-05-23 DIAGNOSIS — F22 Delusional disorders: Secondary | ICD-10-CM | POA: Diagnosis not present

## 2017-05-23 DIAGNOSIS — F900 Attention-deficit hyperactivity disorder, predominantly inattentive type: Secondary | ICD-10-CM | POA: Diagnosis not present

## 2017-05-23 DIAGNOSIS — F419 Anxiety disorder, unspecified: Secondary | ICD-10-CM | POA: Diagnosis not present

## 2017-05-23 DIAGNOSIS — R4587 Impulsiveness: Secondary | ICD-10-CM | POA: Diagnosis not present

## 2017-05-23 DIAGNOSIS — R450 Nervousness: Secondary | ICD-10-CM | POA: Diagnosis not present

## 2017-05-23 DIAGNOSIS — F312 Bipolar disorder, current episode manic severe with psychotic features: Secondary | ICD-10-CM

## 2017-05-23 DIAGNOSIS — R443 Hallucinations, unspecified: Secondary | ICD-10-CM | POA: Diagnosis not present

## 2017-05-23 DIAGNOSIS — R4586 Emotional lability: Secondary | ICD-10-CM

## 2017-05-23 MED ORDER — STERILE WATER FOR INJECTION IJ SOLN
INTRAMUSCULAR | Status: AC
  Administered 2017-05-23: 1.2 mL
  Filled 2017-05-23: qty 10
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
## 2017-05-23 NOTE — ED Notes (Signed)
Patient is irritable and agitated and requesting an injection to settle her down. Patient will be medicated per [facility] for increase agitation. Encouragement and support provided and safety maintain. Q 15 min safety check remain in place an video monitoring.

## 2017-05-23 NOTE — Progress Notes (Signed)
05/23/17 1349:  LRT went to pt room to offer activities, pt was sleep.   Henessy Rohrer, LRT/CTRS  

## 2017-05-23 NOTE — ED Notes (Signed)
On the phone 
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
## 2017-05-23 NOTE — ED Notes (Signed)
Up to the bathroom 

## 2017-05-23 NOTE — ED Notes (Signed)
Patient denies SI,HI and AVH. Patient is hyperactive and restless at this time. Plan of care discussed. Encouragement and support provided and safety maintain. Q 15 min safety checks remain in place and video monitoring.

## 2017-05-23 NOTE — BH Assessment (Signed)
B[facility] Assessment Progress Note  Per Nanine Means, DNP, this pt requires psychiatric hospitalization at this time.  The following facilities have been contacted to seek placement for this pt, with results as noted:  Beds available, information sent, decision pending:  [facility]   At capacity:  [facility]   Doylene Canning, [place] Triage Specialist [phone]

## 2017-05-24 NOTE — BH Assessment (Addendum)
B[facility] Assessment Progress Note  Per Thedore Mins, MD, this pt continues to require psychiatric hospitalization at this time.  The following facilities have been contacted to seek placement for this pt, with results as noted:  Beds available, information sent, decision pending:  [facility]   At capacity:  [facility]   Doylene Canning, [place] Triage Specialist [phone]

## 2017-05-24 NOTE — Progress Notes (Signed)
05/24/17 1402:  LRT went to pt room to offer activities, pt was sleep.  Caroll Rancher, LRT/CTRS
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
## 2017-05-24 NOTE — ED Notes (Signed)
Patient denies SI/HI/AVH. Plan of care discussed. Encouragement and support provided and safety maintain. Q 15 min safety checks remain in place and video monitoring.  

## 2017-05-25 DIAGNOSIS — F17210 Nicotine dependence, cigarettes, uncomplicated: Secondary | ICD-10-CM | POA: Diagnosis not present

## 2017-05-25 DIAGNOSIS — F309 Manic episode, unspecified: Secondary | ICD-10-CM

## 2017-05-25 DIAGNOSIS — F22 Delusional disorders: Secondary | ICD-10-CM | POA: Diagnosis not present

## 2017-05-25 DIAGNOSIS — F3131 Bipolar disorder, current episode depressed, mild: Secondary | ICD-10-CM | POA: Diagnosis not present

## 2017-05-25 MED ORDER — RISPERIDONE 1 MG PO TABS: 1.0000 mg | ORAL_TABLET | Freq: Two times a day (BID) | ORAL | 0 refills | Status: DC

## 2017-05-25 NOTE — ED Notes (Signed)
Pt discharged home. Discharged instructions read to pt who verbalized understanding. All belongings returned to pt who signed for same. Denies SI/HI, is not delusional and not responding to internal stimuli. Escorted pt to the ED exit.    

## 2017-05-25 NOTE — BHH Suicide Risk Assessment (Signed)
Suicide Risk Assessment  Discharge Assessment   [facility] Discharge Suicide Risk Assessment   Principal Problem: Bipolar affective disorder, depressed, mild (HCC) Discharge Diagnoses:  Patient Active Problem List   Diagnosis Date Noted  . Bipolar affective disorder, depressed, mild (HCC) [F31.31] 11/08/2014    Priority: High  . Polysubstance abuse [F19.10] 11/08/2014    Priority: High  . Suicidal ideations [R45.851] 11/08/2014    Priority: High  . Bipolar affective disorder, current episode manic with psychotic symptoms (HCC) [F31.2] 05/22/2017  . Attention deficit hyperactivity disorder (ADHD), predominantly inattentive type [F90.0]   . Bipolar 1 disorder (HCC) [F31.9] 03/23/2015  . Alcohol abuse [F10.10] 03/23/2015  . ADHD (attention deficit hyperactivity disorder) [F90.9] 03/23/2015  . Paranoia (HCC) [F22] 03/23/2015  . Fatty infiltration of liver [K76.0] 02/28/2013  . Macrocytosis without anemia [D75.89] 02/28/2013  . Elevation of level of transaminase or lactic acid dehydrogenase (LDH) [R74.0] 02/28/2013  . Rhabdomyolysis [M62.82] 12/07/2011  . Accidental poisoning by unspecified carbon monoxide(E868.9) [T58.91XA] 12/07/2011  . UTI (lower urinary tract infection) [N39.0] 12/07/2011    Total Time spent with patient: 30 minutes  Musculoskeletal: Strength & Muscle Tone: within normal limits Gait & Station: normal Patient leans: N/A  Psychiatric Specialty Exam: Physical Exam  Constitutional: She is oriented to person, place, and time. She appears well-developed and well-nourished.  HENT:  Head: Normocephalic.  Neck: Normal range of motion.  Respiratory: Effort normal.  Musculoskeletal: Normal range of motion.  Neurological: She is alert and oriented to person, place, and time.  Psychiatric: She has a normal mood and affect. Her speech is normal. Judgment and thought content normal. Cognition and memory are normal.    Review of Systems  All other systems reviewed and are  negative.   Blood pressure (!) 147/88, pulse 90, temperature 97.6 F (36.4 C), temperature source Oral, resp. rate 17, SpO2 100 %.There is no height or weight on file to calculate BMI.  General Appearance: Casual  Eye Contact:  Good  Speech:  Normal Rate  Volume:  Normal  Mood:  Euthymic  Affect:  Congruent  Thought Process:  Coherent and Descriptions of Associations: Intact  Orientation:  Full (Time, Place, and Person)  Thought Content:  WDL and Logical  Suicidal Thoughts:  No  Homicidal Thoughts:  No  Memory:  Immediate;   Good Recent;   Good Remote;   Good  Judgement:  Fair  Insight:  Fair  Psychomotor Activity:  Normal  Concentration:  Concentration: Good and Attention Span: Good  Recall:  Good  Fund of Knowledge:  Fair  Language:  Good  Akathisia:  No  Handed:  Right  AIMS (if indicated):     Assets:  Housing Leisure Time Physical Health Resilience Social Support  ADL's:  Intact  Cognition:  WNL  Sleep:       Mental Status Per Nursing Assessment::   On Admission:   mania with paranoia  Demographic Factors:  Living alone  Loss Factors: NA  Historical Factors: NA  Risk Reduction Factors:   Sense of responsibility to family, Positive social support and Positive therapeutic relationship  Continued Clinical Symptoms:  NOne  Cognitive Features That Contribute To Risk:  None    Suicide Risk:  Minimal: No identifiable suicidal ideation.  Patients presenting with no risk factors but with morbid ruminations; may be classified as minimal risk based on the severity of the depressive symptoms    Plan Of Care/Follow-up recommendations:  Activity:  as tolerated Diet:  heart healthy diet  Donyetta Ogletree,  Zayden Hahne, NP 05/25/2017, 12:24 PM

## 2017-05-25 NOTE — BH Assessment (Signed)
BHH Assessment Progress Note  Per Thedore Mins, MD, this pt does not require psychiatric hospitalization at this time.  Pt is to be discharged from [facility] with recommendation to continue treatment with her current provider, Leata Mouse, MD.  This has been included in pt's discharge instructions.  Pt's nurse has been notified.  Doylene Canning, [place] Triage Specialist [phone]

## 2017-05-25 NOTE — Consult Note (Signed)
[facility] Face-to-Face Psychiatry Consult   Reason for Consult:  Mania with paranoia Referring Physician:  EDP Patient Identification: Carolyn Jennings MRN:  [id number] Principal Diagnosis: Bipolar affective disorder, depressed, mild (HCC) Diagnosis:   Patient Active Problem List   Diagnosis Date Noted  . Bipolar affective disorder, depressed, mild (HCC) [F31.31] 11/08/2014    Priority: High  . Polysubstance abuse [F19.10] 11/08/2014    Priority: High  . Suicidal ideations [R45.851] 11/08/2014    Priority: High  . Bipolar affective disorder, current episode manic with psychotic symptoms (HCC) [F31.2] 05/22/2017  . Attention deficit hyperactivity disorder (ADHD), predominantly inattentive type [F90.0]   . Bipolar 1 disorder (HCC) [F31.9] 03/23/2015  . Alcohol abuse [F10.10] 03/23/2015  . ADHD (attention deficit hyperactivity disorder) [F90.9] 03/23/2015  . Paranoia (HCC) [F22] 03/23/2015  . Fatty infiltration of liver [K76.0] 02/28/2013  . Macrocytosis without anemia [D75.89] 02/28/2013  . Elevation of level of transaminase or lactic acid dehydrogenase (LDH) [R74.0] 02/28/2013  . Rhabdomyolysis [M62.82] 12/07/2011  . Accidental poisoning by unspecified carbon monoxide(E868.9) [T58.91XA] 12/07/2011  . UTI (lower urinary tract infection) [N39.0] 12/07/2011    Total Time spent with patient: 30 minutes  Subjective:   Carolyn Jennings is a 46 y.o. female patient has stabilized.  HPI:  46 yo female who presented to the ED with mania and paranoia, possible substance abuse.  Medications were adjusted and she has stabilized.  No suicidal/homicidal ideations, hallucinations, and withdrawal symptoms.  Stable for discharge to return to her regular psychiatrist.  Past Psychiatric History: polysubstance abuse, bipolar disorder  Risk to Self: Suicidal Ideation: No Suicidal Intent: No Is patient at risk for suicide?: No Suicidal Plan?: No Access to Means: No What has been your use of  drugs/alcohol within the last 12 months?: UTA Triggers for Past Attempts: Unknown Intentional Self Injurious Behavior:  (UNKNOWN) Risk to Others: None Prior Inpatient Therapy: Prior Inpatient Therapy: Yes Prior Therapy Dates: 2016 Prior Therapy Facilty/Provider(s): [facility] Reason for Treatment: MDD, PSYCHOSIS, DELUSIONS Prior Outpatient Therapy: Prior Outpatient Therapy:  (UNKNOWN) Does patient have an ACCT team?: Unknown Does patient have Intensive In-House Services?  : Unknown Does patient have [facility] services? : Unknown Does patient have P4CC services?: Unknown  Past Medical History:  Past Medical History:  Diagnosis Date  . ADD (attention deficit disorder)   . Anxiety   . ASCUS with positive high risk HPV 2007  . Bipolar 1 disorder (HCC)   . Depression   . Migraines    History reviewed. No pertinent surgical history. Family History:  Family History  Problem Relation Age of Onset  . Heart disease Father   . Hypertension Father   . Heart attack Father   . Diabetes Mother        Type 1   Family Psychiatric  History: unknown Social History:  History  Alcohol Use  . 1.1 oz/week  . 1 Glasses of wine, 1 Standard drinks or equivalent per week    Comment: every week     History  Drug Use No    Social History   Social History  . Marital status: Married    Spouse name: N/A  . Number of children: N/A  . Years of education: N/A   Social History Main Topics  . Smoking status: Current Every Day Smoker    Packs/day: 1.50    Years: 15.00    Types: Cigarettes  . Smokeless tobacco: Never Used  . Alcohol use 1.1 oz/week    1 Glasses of wine, 1  Standard drinks or equivalent per week     Comment: every week  . Drug use: No  . Sexual activity: Yes    Birth control/ protection: None   Other Topics Concern  . None   Social History Narrative  . None   Additional Social History:    Allergies:   Allergies  Allergen Reactions  . Valerie Salts D-S] Swelling  .  Doxycycline Nausea Only  . Metronidazole   . Penicillins   . Progesterone     Tongue swelling  . Ampicillin Rash  . Diflucan [Fluconazole] Rash  . Hydrocodone Rash  . Oxycodone Rash    Pt cannot take any opioid    Labs: No results found for this or any previous visit (from the past 48 hour(s)).  Current Facility-Administered Medications  Medication Dose Route Frequency Provider Last Rate Last Dose  . gabapentin (NEURONTIN) capsule 300 mg  300 mg Oral BID Shemeca Lukasik, MD      . risperiDONE (RISPERDAL) tablet 1 mg  1 mg Oral BID Jannifer Franklin, Kristyana Notte, MD   1 mg at 05/24/17 2136   Current Outpatient Prescriptions  Medication Sig Dispense Refill  . amphetamine-dextroamphetamine (ADDERALL) 30 MG tablet Take 30 mg by mouth 2 (two) times daily.     . ARIPiprazole (ABILIFY) 10 MG tablet Take 10 mg by mouth daily.     . clonazePAM (KLONOPIN) 1 MG tablet Take 1 mg by mouth 2 (two) times daily.    Marland Kitchen lamoTRIgine (LAMICTAL) 25 MG tablet Take 1 tablet (25 mg total) by mouth daily. 30 tablet 0  . fluorouracil (EFUDEX) 5 % cream Apply topically 2 (two) times daily. (Patient not taking: Reported on 05/22/2017) 40 g 0    Musculoskeletal: Strength & Muscle Tone: within normal limits Gait & Station: normal Patient leans: N/A  Psychiatric Specialty Exam: Physical Exam  Constitutional: She is oriented to person, place, and time. She appears well-developed and well-nourished.  HENT:  Head: Normocephalic.  Neck: Normal range of motion.  Respiratory: Effort normal.  Musculoskeletal: Normal range of motion.  Neurological: She is alert and oriented to person, place, and time.  Psychiatric: She has a normal mood and affect. Her speech is normal. Judgment and thought content normal. Cognition and memory are normal.    Review of Systems  All other systems reviewed and are negative.   Blood pressure (!) 147/88, pulse 90, temperature 97.6 F (36.4 C), temperature source Oral, resp. rate 17, SpO2 100  %.There is no height or weight on file to calculate BMI.  General Appearance: Casual  Eye Contact:  Good  Speech:  Normal Rate  Volume:  Normal  Mood:  Euthymic  Affect:  Congruent  Thought Process:  Coherent and Descriptions of Associations: Intact  Orientation:  Full (Time, Place, and Person)  Thought Content:  WDL and Logical  Suicidal Thoughts:  No  Homicidal Thoughts:  No  Memory:  Immediate;   Good Recent;   Good Remote;   Good  Judgement:  Fair  Insight:  Fair  Psychomotor Activity:  Normal  Concentration:  Concentration: Good and Attention Span: Good  Recall:  Good  Fund of Knowledge:  Fair  Language:  Good  Akathisia:  No  Handed:  Right  AIMS (if indicated):     Assets:  Housing Leisure Time Physical Health Resilience Social Support  ADL's:  Intact  Cognition:  WNL  Sleep:        Treatment Plan Summary: Daily contact with patient to assess and evaluate  symptoms and progress in treatment, Medication management and Plan bipolar affective disorder:  -Crisis stabilization -Medication management:  Discontinued her Adderall and Klonopin, continued her Risperdal 1 mg BID for psychosis and Gabapentin 300 mg BID for mood stabilization -Individual counseling  Disposition: No evidence of imminent risk to self or others at present.    Nanine Means, NP 05/25/2017 9:51 AM  Patient seen face-to-face for psychiatric evaluation, chart reviewed and case discussed with the physician extender and developed treatment plan. Reviewed the information documented and agree with the treatment plan. Thedore Mins, MD

## 2017-05-25 NOTE — Discharge Instructions (Signed)
For your behavioral health needs, you are advised to continue treatment with Leata Mouse, MD, your current psychiatrist:       Leata Mouse, MD      [facility]      [address]      [place], [place] [address]      [phone]

## 2017-11-05 DIAGNOSIS — F909 Attention-deficit hyperactivity disorder, unspecified type: Secondary | ICD-10-CM | POA: Diagnosis not present

## 2017-11-05 DIAGNOSIS — F3181 Bipolar II disorder: Secondary | ICD-10-CM | POA: Diagnosis not present

## 2017-12-03 DIAGNOSIS — F909 Attention-deficit hyperactivity disorder, unspecified type: Secondary | ICD-10-CM | POA: Diagnosis not present

## 2017-12-03 DIAGNOSIS — F3181 Bipolar II disorder: Secondary | ICD-10-CM | POA: Diagnosis not present

## 2018-01-01 DIAGNOSIS — F3181 Bipolar II disorder: Secondary | ICD-10-CM | POA: Diagnosis not present

## 2018-02-12 ENCOUNTER — Emergency Department (HOSPITAL_COMMUNITY): Payer: Medicare Other

## 2018-02-12 ENCOUNTER — Emergency Department (HOSPITAL_COMMUNITY)
Admission: EM | Admit: 2018-02-12 | Discharge: 2018-02-12 | Disposition: A | Discharge status: Home / Self Care | Payer: Medicare Other | Attending: Emergency Medicine | Admitting: Emergency Medicine

## 2018-02-12 ENCOUNTER — Encounter (HOSPITAL_COMMUNITY): Payer: Self-pay | Admitting: *Deleted

## 2018-02-12 DIAGNOSIS — S70361A Insect bite (nonvenomous), right thigh, initial encounter: Secondary | ICD-10-CM | POA: Diagnosis not present

## 2018-02-12 DIAGNOSIS — R110 Nausea: Secondary | ICD-10-CM | POA: Insufficient documentation

## 2018-02-12 DIAGNOSIS — Z79899 Other long term (current) drug therapy: Secondary | ICD-10-CM | POA: Diagnosis not present

## 2018-02-12 DIAGNOSIS — R079 Chest pain, unspecified: Secondary | ICD-10-CM | POA: Diagnosis not present

## 2018-02-12 DIAGNOSIS — I10 Essential (primary) hypertension: Secondary | ICD-10-CM | POA: Insufficient documentation

## 2018-02-12 DIAGNOSIS — F17210 Nicotine dependence, cigarettes, uncomplicated: Secondary | ICD-10-CM | POA: Insufficient documentation

## 2018-02-12 DIAGNOSIS — Y939 Activity, unspecified: Secondary | ICD-10-CM | POA: Insufficient documentation

## 2018-02-12 DIAGNOSIS — F1010 Alcohol abuse, uncomplicated: Secondary | ICD-10-CM | POA: Insufficient documentation

## 2018-02-12 DIAGNOSIS — Y999 Unspecified external cause status: Secondary | ICD-10-CM | POA: Diagnosis not present

## 2018-02-12 DIAGNOSIS — Y929 Unspecified place or not applicable: Secondary | ICD-10-CM | POA: Diagnosis not present

## 2018-02-12 DIAGNOSIS — R5383 Other fatigue: Secondary | ICD-10-CM | POA: Diagnosis not present

## 2018-02-12 DIAGNOSIS — Z720 Tobacco use: Secondary | ICD-10-CM

## 2018-02-12 DIAGNOSIS — W57XXXA Bitten or stung by nonvenomous insect and other nonvenomous arthropods, initial encounter: Secondary | ICD-10-CM | POA: Diagnosis not present

## 2018-02-12 LAB — COMPREHENSIVE METABOLIC PANEL
ALT: 15 U/L (ref 14–54)
AST: 19 U/L (ref 15–41)
Albumin: 4.8 g/dL (ref 3.5–5.0)
Alkaline Phosphatase: 59 U/L (ref 38–126)
Anion gap: 10 (ref 5–15)
BILIRUBIN TOTAL: 0.9 mg/dL (ref 0.3–1.2)
BUN: 8 mg/dL (ref 6–20)
CHLORIDE: 107 mmol/L (ref 101–111)
CO2: 22 mmol/L (ref 22–32)
CREATININE: 0.99 mg/dL (ref 0.44–1.00)
Calcium: 9.8 mg/dL (ref 8.9–10.3)
Glucose, Bld: 129 mg/dL — ABNORMAL HIGH (ref 65–99)
POTASSIUM: 3.7 mmol/L (ref 3.5–5.1)
Sodium: 139 mmol/L (ref 135–145)
TOTAL PROTEIN: 8 g/dL (ref 6.5–8.1)

## 2018-02-12 LAB — CBC WITH DIFFERENTIAL/PLATELET
Abs Immature Granulocytes: 0 10*3/uL (ref 0.0–0.1)
BASOS ABS: 0.1 10*3/uL (ref 0.0–0.1)
Basophils Relative: 1 %
EOS ABS: 0 10*3/uL (ref 0.0–0.7)
Eosinophils Relative: 0 %
HEMATOCRIT: 48.8 % — AB (ref 36.0–46.0)
Hemoglobin: 16.4 g/dL — ABNORMAL HIGH (ref 12.0–15.0)
IMMATURE GRANULOCYTES: 0 %
Lymphocytes Relative: 22 %
Lymphs Abs: 2.2 10*3/uL (ref 0.7–4.0)
MCH: 32.5 pg (ref 26.0–34.0)
MCHC: 33.6 g/dL (ref 30.0–36.0)
MCV: 96.6 fL (ref 78.0–100.0)
Monocytes Absolute: 0.6 10*3/uL (ref 0.1–1.0)
Monocytes Relative: 6 %
NEUTROS PCT: 71 %
Neutro Abs: 7 10*3/uL (ref 1.7–7.7)
PLATELETS: 343 10*3/uL (ref 150–400)
RBC: 5.05 MIL/uL (ref 3.87–5.11)
RDW: 13.1 % (ref 11.5–15.5)
WBC: 10 10*3/uL (ref 4.0–10.5)

## 2018-02-12 LAB — RAPID URINE DRUG SCREEN, HOSP PERFORMED
AMPHETAMINES: POSITIVE — AB
BENZODIAZEPINES: POSITIVE — AB
COCAINE: NOT DETECTED
OPIATES: NOT DETECTED
Tetrahydrocannabinol: NOT DETECTED

## 2018-02-12 LAB — URINALYSIS, ROUTINE W REFLEX MICROSCOPIC
GLUCOSE, UA: NEGATIVE mg/dL
Ketones, ur: NEGATIVE mg/dL
LEUKOCYTES UA: NEGATIVE
NITRITE: NEGATIVE
PH: 6 (ref 5.0–8.0)
Protein, ur: NEGATIVE mg/dL
Specific Gravity, Urine: 1.024 (ref 1.005–1.030)

## 2018-02-12 LAB — ACETAMINOPHEN LEVEL: Acetaminophen (Tylenol), Serum: <10 ug/mL — ABNORMAL LOW (ref 10–30)

## 2018-02-12 LAB — I-STAT BETA HCG BLOOD, ED (MC, WL, AP ONLY): I-stat hCG, quantitative: <5 m[IU]/mL (ref <5)

## 2018-02-12 LAB — SALICYLATE LEVEL

## 2018-02-12 LAB — I-STAT CG4 LACTIC ACID, ED: LACTIC ACID, VENOUS: 1.54 mmol/L (ref 0.5–1.9)

## 2018-02-12 LAB — ETHANOL: Alcohol, Ethyl (B): <10 mg/dL (ref <10)

## 2018-02-12 MED ORDER — SODIUM CHLORIDE 0.9 % IV BOLUS
1000.0000 mL | Freq: Once | INTRAVENOUS | Status: AC
  Administered 2018-02-12: 1000 mL via INTRAVENOUS

## 2018-02-12 MED ORDER — HYDROCHLOROTHIAZIDE 12.5 MG PO CAPS
12.5000 mg | ORAL_CAPSULE | Freq: Once | ORAL | Status: AC
  Administered 2018-02-12: 12.5 mg via ORAL
  Filled 2018-02-12: qty 1

## 2018-02-12 MED ORDER — KETOROLAC TROMETHAMINE 30 MG/ML IJ SOLN
30.0000 mg | Freq: Once | INTRAMUSCULAR | Status: AC
  Administered 2018-02-12: 30 mg via INTRAVENOUS
  Filled 2018-02-12: qty 1

## 2018-02-12 MED ORDER — SULFAMETHOXAZOLE-TRIMETHOPRIM 800-160 MG PO TABS
1.0000 | ORAL_TABLET | Freq: Two times a day (BID) | ORAL | 0 refills | Status: DC
Start: 2018-02-12 — End: 2018-03-20

## 2018-02-12 MED ORDER — ONDANSETRON 4 MG PO TBDP: 4.0000 mg | ORAL_TABLET | Freq: Once | ORAL | Status: DC

## 2018-02-12 MED ORDER — SULFAMETHOXAZOLE-TRIMETHOPRIM 800-160 MG PO TABS
1.0000 | ORAL_TABLET | Freq: Once | ORAL | Status: AC
  Administered 2018-02-12: 1 via ORAL
  Filled 2018-02-12: qty 1

## 2018-02-12 MED ORDER — ONDANSETRON 4 MG PO TBDP: 4.0000 mg | ORAL_TABLET | Freq: Three times a day (TID) | ORAL | 0 refills | Status: DC | PRN

## 2018-02-12 MED ORDER — ONDANSETRON HCL 4 MG/2ML IJ SOLN
4.0000 mg | Freq: Once | INTRAMUSCULAR | Status: AC
  Administered 2018-02-12: 4 mg via INTRAVENOUS
  Filled 2018-02-12: qty 2

## 2018-02-12 MED ORDER — HYDROCHLOROTHIAZIDE 12.5 MG PO TABS: 12.5000 mg | ORAL_TABLET | Freq: Every day | ORAL | 0 refills | Status: DC

## 2018-02-12 NOTE — ED Triage Notes (Signed)
Pt in c/o possible spider bite to her right thigh, pt thinks she was bite by a brown reluse, family reports one was seen in their home. Wound was first noted two weeks ago, and thought she burned herself when she first noted the wound, and over the two weeks it has grown and the center tissue is necrotic, wound is now approx 2 cm in length with redness around the edges. Pt is noted to be answering questions slowly, family states she is not acting like her self.

## 2018-02-12 NOTE — ED Notes (Signed)
Called radiology to see if pt was in x-ray. Pt is not in x-ray at this time.

## 2018-02-12 NOTE — ED Provider Notes (Signed)
[facility] EMERGENCY DEPARTMENT Provider Note   CSN: [id number] Arrival date & time: 02/12/18  1136     History   Chief Complaint Chief Complaint  Patient presents with  . Insect Bite    HPI Carolyn Jennings is a 47 y.o. female with a PMHx of HTN, ADD, anxiety, bipolar disorder, depression, migraines, alcoholism, and other conditions listed below, who presents to the ED with complaints of "brown recluse" spider bite to the right outer thigh that occurred 2 weeks ago.  Patient states that her family member saw a brown recluse spider so she knows that it had to be that which bit her, and she believes that the poison is going into her body so she came in for evaluation.  She complains of 10/10 constant throbbing nonradiating right outer thigh pain around the insect bite, with no known aggravating factors, and unrelieved with aspirin.  She reports associated body aches, erythema around the wound, swelling around the wound, chills, fatigue, and nausea.  She admits to being a cigarette smoker.  She denies alcohol use or drug use.  She states that she has not taken her blood pressure medications today, she cannot recall the name of the medication but she says that it something for ADHD that also helps with blood pressure, when asked whether it could be Minipress which is in her chart she denies ever being on this medication, and then later states that she is not on any blood pressure medications and gets upset about the fact that minipress is in her chart because she doesn't recall ever being on that medication.  She is requesting to be started on something for BP today because she says her BP is always high.    She denies any warmth, drainage, or red streaking from the wound, as well as denying any fevers, CP, SOB, cough, abdominal pain, vomiting, diarrhea, constipation, obstipation, melena, hematochezia, dysuria, hematuria, vaginal bleeding or discharge, focal arthralgias,  numbness, tingling, focal weakness, or any other complaints at this time.  Of note, apparently triage staff was told by her family that the pt wasn't acting herself lately; her family member was in the room during the evaluation and did not mention any concerns about her acting differently. There was no mention of this to me during her evaluation.   The history is provided by the patient and medical records. No language interpreter was used.    Past Medical History:  Diagnosis Date  . ADD (attention deficit disorder)   . Anxiety   . ASCUS with positive high risk HPV 2007  . Bipolar 1 disorder (HCC)   . Depression   . Migraines     Patient Active Problem List   Diagnosis Date Noted  . Bipolar affective disorder, current episode manic with psychotic symptoms (HCC) 05/22/2017  . Attention deficit hyperactivity disorder (ADHD), predominantly inattentive type   . Bipolar 1 disorder (HCC) 03/23/2015  . Alcohol abuse 03/23/2015  . ADHD (attention deficit hyperactivity disorder) 03/23/2015  . Paranoia (HCC) 03/23/2015  . Bipolar affective disorder, depressed, mild (HCC) 11/08/2014  . Polysubstance abuse (HCC) 11/08/2014  . Suicidal ideations 11/08/2014  . Fatty infiltration of liver 02/28/2013  . Macrocytosis without anemia 02/28/2013  . Elevation of level of transaminase or lactic acid dehydrogenase (LDH) 02/28/2013  . Rhabdomyolysis 12/07/2011  . Accidental poisoning by unspecified carbon monoxide(E868.9) 12/07/2011  . UTI (lower urinary tract infection) 12/07/2011    History reviewed. No pertinent surgical history.   OB History  Gravida  2   Para  2   Term  2   Preterm      AB      Living  2     SAB      TAB      Ectopic      Multiple      Live Births  2            Home Medications    Prior to Admission medications   Medication Sig Start Date End Date Taking? Authorizing Provider  amphetamine-dextroamphetamine (ADDERALL) 30 MG tablet Take 30 mg by  mouth 2 (two) times daily.  02/12/09   [provider]  ARIPiprazole (ABILIFY) 10 MG tablet Take 10 mg by mouth daily.     [provider]  clonazePAM (KLONOPIN) 1 MG tablet Take 1 mg by mouth 2 (two) times daily.    [provider]  fluorouracil (EFUDEX) 5 % cream Apply topically 2 (two) times daily. Patient not taking: Reported on 05/22/2017 07/24/16   Elinor ParkinsonHyatt, Max T, DPM  lamoTRIgine (LAMICTAL) 25 MG tablet Take 1 tablet (25 mg total) by mouth daily. 03/29/15   Thermon Leylandavis, Laura A, NP  risperiDONE (RISPERDAL) 1 MG tablet Take 1 tablet (1 mg total) by mouth 2 (two) times daily. 05/25/17   Charm RingsLord, Jamison Y, NP    Family History Family History  Problem Relation Age of Onset  . Heart disease Father   . Hypertension Father   . Heart attack Father   . Diabetes Mother        Type 1    Social History Social History   Tobacco Use  . Smoking status: Current Every Day Smoker    Packs/day: 1.50    Years: 15.00    Pack years: 22.50    Types: Cigarettes  . Smokeless tobacco: Never Used  Substance Use Topics  . Alcohol use: Yes    Alcohol/week: 1.2 oz    Types: 1 Glasses of wine, 1 Standard drinks or equivalent per week    Comment: every week  . Drug use: No     Allergies   Ninfa LindenUribel [uretron d-s]; Doxycycline; Metronidazole; Penicillins; Progesterone; Ampicillin; Diflucan [fluconazole]; Hydrocodone; and Oxycodone   Review of Systems Review of Systems  Constitutional: Positive for chills and fatigue. Negative for fever.  Respiratory: Negative for cough and shortness of breath.   Cardiovascular: Negative for chest pain.  Gastrointestinal: Positive for nausea. Negative for abdominal pain, blood in stool, constipation, diarrhea and vomiting.  Genitourinary: Negative for dysuria, hematuria, vaginal bleeding and vaginal discharge.  Musculoskeletal: Positive for myalgias. Negative for arthralgias.  Skin: Positive for color change and wound.  Allergic/Immunologic: Negative  for immunocompromised state.  Neurological: Negative for weakness and numbness.   All other systems reviewed and are negative for acute change except as noted in the HPI.    Physical Exam Updated Vital Signs BP (!) 173/101   Pulse (!) 104   Temp 98.9 F (37.2 C) (Oral)   Resp 17   Ht 5\' 1"  (1.549 m)   Wt 65.8 kg (145 lb)   LMP  (LMP Unknown)   SpO2 100%   BMI 27.40 kg/m   Physical Exam  Constitutional: She is oriented to person, place, and time. She appears well-developed and well-nourished.  Non-toxic appearance. No distress.  Afebrile, nontoxic, NAD, HTN noted but similar to prior visits  HENT:  Head: Normocephalic and atraumatic.  Mouth/Throat: Oropharynx is clear and moist. Mucous membranes are dry.  Dry lips  Eyes: Pupils are equal, round, and reactive to light. Conjunctivae and EOM are normal. Right eye exhibits no discharge. Left eye exhibits no discharge.  PERRL, EOMI, no nystagmus   Neck: Normal range of motion. Neck supple.  Cardiovascular: Regular rhythm, normal heart sounds and intact distal pulses. Tachycardia present. Exam reveals no gallop and no friction rub.  No murmur heard. Mildly tachycardic in the low 100s. No pedal edema  Pulmonary/Chest: Effort normal and breath sounds normal. No respiratory distress. She has no decreased breath sounds. She has no wheezes. She has no rhonchi. She has no rales.  Abdominal: Soft. Normal appearance and bowel sounds are normal. She exhibits no distension. There is no tenderness. There is no rigidity, no rebound, no guarding, no CVA tenderness, no tenderness at McBurney's point and negative Murphy's sign.  Musculoskeletal: Normal range of motion.  MAE x4 Strength and sensation grossly intact in all extremities Distal pulses intact See skin exam below for description of R thigh wound  Neurological: She is alert and oriented to person, place, and time. She has normal strength. No cranial nerve deficit or sensory deficit. GCS eye  subscore is 4. GCS verbal subscore is 5. GCS motor subscore is 6.  A&O x4, GCS 15, no focal neuro deficits on exam. Speech slow but clear and not slurred, no difficulty word finding.   Skin: Skin is warm, dry and intact. No rash noted. There is erythema.  R lateral thigh with ~3cm circular scab with scant amount of erythema around the wound edges, no red streaking, no drainage or warmth, no swelling, no induration or fluctuance. No necrosis noted in the wound. SEE PICTURE BELOW  Psychiatric: Her affect is labile. Her speech is not tangential and not slurred. She is communicative.  Somewhat labile mood, gets upset easily; somewhat slow speech but still clear and without slurring or other abnormalities in speech, communicative without difficulty  Nursing note and vitals reviewed.      ED Treatments / Results  Labs (all labs ordered are listed, but only abnormal results are displayed) Labs Reviewed  COMPREHENSIVE METABOLIC PANEL - Abnormal; Notable for the following components:      Result Value   Glucose, Bld 129 (*)    All other components within normal limits  CBC WITH DIFFERENTIAL/PLATELET - Abnormal; Notable for the following components:   Hemoglobin 16.4 (*)    HCT 48.8 (*)    All other components within normal limits  URINALYSIS, ROUTINE W REFLEX MICROSCOPIC - Abnormal; Notable for the following components:   APPearance HAZY (*)    Hgb urine dipstick SMALL (*)    Bilirubin Urine SMALL (*)    Bacteria, UA FEW (*)    All other components within normal limits  RAPID URINE DRUG SCREEN, HOSP PERFORMED - Abnormal; Notable for the following components:   Benzodiazepines POSITIVE (*)    Amphetamines POSITIVE (*)    Barbiturates   (*)    Value: Result not available. Reagent lot number recalled by manufacturer.   All other components within normal limits  ACETAMINOPHEN LEVEL - Abnormal; Notable for the following components:   Acetaminophen (Tylenol), Serum <10 (*)    All other  components within normal limits  ETHANOL  SALICYLATE LEVEL  I-STAT CG4 LACTIC ACID, ED  I-STAT BETA HCG BLOOD, ED ([facility], [facility], [facility] ONLY)    EKG EKG Interpretation  Date/Time:  Tuesday February 12 2018 12:28:37 EDT Ventricular Rate:  108 PR Interval:    QRS Duration: 90 QT Interval:  344  QTC Calculation: 462 R Axis:     Text Interpretation:  Sinus tachycardia Baseline wander in lead(s) V5 Confirmed by Tilden Fossa [phone]) on 02/12/2018 1:33:15 PM   Radiology Dg Chest 2 View  Result Date: 02/12/2018 CLINICAL DATA:  Generalized pain and weakness. Bitten by a brown recluse spider on the right leg 1.5-2 weeks ago. Current smoker. EXAM: CHEST - 2 VIEW COMPARISON:  None in PACs FINDINGS: The lungs are mildly hyperinflated and clear. The heart and pulmonary vascularity are normal. The mediastinum is normal in width. There is no pleural effusion. The bony thorax is unremarkable. IMPRESSION: Mild hyperinflation likely reflects the smoking history and chronic bronchitic change. There is no acute cardiopulmonary abnormality. Electronically Signed   By: David  [place] M.D.   On: 02/12/2018 13:07    Procedures Procedures (including critical care time)  Medications Ordered in ED Medications  sodium chloride 0.9 % bolus 1,000 mL (0 mLs Intravenous Stopped 02/12/18 1603)  hydrochlorothiazide (MICROZIDE) capsule 12.5 mg (12.5 mg Oral Given 02/12/18 1550)  sulfamethoxazole-trimethoprim (BACTRIM DS,SEPTRA DS) 800-160 MG per tablet 1 tablet (1 tablet Oral Given 02/12/18 1550)  ondansetron (ZOFRAN) injection 4 mg (4 mg Intravenous Given 02/12/18 1549)  ketorolac (TORADOL) 30 MG/ML injection 30 mg (30 mg Intravenous Given 02/12/18 1549)     Initial Impression / Assessment and Plan / ED Course  I have reviewed the triage vital signs and the nursing notes.  Pertinent labs & imaging results that were available during my care of the patient were reviewed by me and considered in my medical decision making (see  chart for details).     47 y.o. female here with insect bite to R thigh, states it was a brown recluse. Convinced that the poison is getting into her body. Feels fatigued and nauseated. On exam, speech is slow but not slurred and she has no word finding difficulty or other speech difficulty, she's A&Ox4, BP elevated in the 160-170s/100s similar to past visits, mildly tachycardic in the low 100s, lips appear dry; R outer thigh with an ~3cm scabbed over wound with minimal erythema around the edges of the wound, no drainage, no necrosis of skin, no warmth, no red streaking. Apparently family expressed some concern for pt not acting herself, so work up that has been done thus far reveals: U/A with slight contamination 6-10 squamous cells but otherwise no evidence of UTI (nitrite/leuk neg, 6-10 RBC and WBCs, few bacteria); lactic WNL; betaHCG neg; CMP with mildly elevated gluc 129 but otherwise WNL; CBC w/diff with hemoconcentration, H/H higher than usual, but no leukocytosis; CXR with chronic bronchitic findings but no acute changes; EKG with sinus tachycardia but no acute ischemic findings. She does not appear confused or altered, just fatigued and speaking slowly; doubt need for head imaging, but will get UDS, EtOH level, acetaminophen/salicylate levels, and ammonia levels. Will give fluids, zofran, and toradol for pain/nausea/tachycardia; will also give bactrim to cover for infection of the insect bite, although no actual evidence of cellulitis but pt very insistent that she has an infection in the bite, and given the erythema will start empiric tx of this; pt also requesting BP meds, states she has something at home but she can't recall what it is, and states it's for ADHD but can help with BP; unclear what this is, minipress is in her chart, but she denies that it's this. Will simply start HCTZ now since pt is hypertensive consistently over the years and likely warrants treatment. Will reassess shortly.  7:36 PM EtOH level undetectable. UDS with +benzos and amphetamines, unable to test for barbituates, rest of UDS negative. Acetaminophen and salicylate levels WNL. Ammonia test hemolyzed, pt very adamant that we don't draw blood that's not off her IV line therefore will just let this be cancelled, she's not altered nor does she have any liver dysfunction, doubt we need this before discharging pt. BP improving to 160s/90s, HR improving into the 70s, pt feeling better and tolerating PO well, speech no longer slowed, pt continues to remain A&O x4 and well appearing; I feel she is stable for d/c. Will d/c home with bactrim for possible infection of insect bite, as well as HCTZ and zofran. Advised OTC remedies for pain control/symptom control, DASH diet, monitoring BP at home and keeping a log, avoiding OTC things that would make BP go up, and f/up with PCP in 5-7 days for wound check, recheck of symptoms, and ongoing management of her BP. Smoking cessation strongly encouraged. I explained the diagnosis and have given explicit precautions to return to the ER including for any other new or worsening symptoms. The patient understands and accepts the medical plan as it's been dictated and I have answered their questions. Discharge instructions concerning home care and prescriptions have been given. The patient is STABLE and is discharged to home in good condition.    Final Clinical Impressions(s) / ED Diagnoses   Final diagnoses:  Insect bite of right thigh, initial encounter  Essential hypertension  Nausea  Fatigue, unspecified type  Tobacco user    ED Discharge Orders        Ordered    ondansetron (ZOFRAN ODT) 4 MG disintegrating tablet  Every 8 hours PRN     02/12/18 1936    sulfamethoxazole-trimethoprim (BACTRIM DS,SEPTRA DS) 800-160 MG tablet  2 times daily     02/12/18 1936    hydrochlorothiazide (HYDRODIURIL) 12.5 MG tablet  Daily     02/12/18 [address], [place], [place] 02/12/18  1937    Tilden Fossa, MD 02/13/18 1329

## 2018-02-12 NOTE — Discharge Instructions (Addendum)
Keep wound clean and dry. Apply warm compresses to affected area throughout the day. Take antibiotic until it is finished. Keep it covered with topical antibiotic ointment and a bandage. Alternate between tylenol and ibuprofen as needed for pain. Stay well hydrated. Use zofran as directed as needed for nausea. Follow-up with your Primary Care doctor in 5-7 days for wound recheck and recheck of symptoms. Monitor area for signs of infection to include, but not limited to: increasing pain, spreading redness, drainage/pus, worsening swelling, or fevers.   Your blood pressure was elevated today. Eat a low salt/low sodium diet, and start taking Hydrochlorothiazide as directed. Keep a log of your blood pressure readings from every morning and evening (making sure to give yourself at least 15 minutes of rest prior to checking it) and take it to your doctor's office at your next appointment for ongoing management of your blood pressure. Stay well hydrated and get plenty of rest. Avoid caffeine and other over the counter products that would make your blood pressure go up (such as decongestants, excedrin, etc). STOP SMOKING!   Follow up with your regular doctor in 5-7 days for recheck of symptoms and ongoing management of your blood pressure. Return to the ER for emergent changes or worsening symptoms.

## 2018-02-12 NOTE — ED Notes (Signed)
Patient transported to X-ray 

## 2018-02-12 NOTE — ED Notes (Signed)
Called pt to go back to room. No response.  

## 2018-02-12 NOTE — ED Notes (Signed)
Dark Green tube from mini lab sent to main lab for add on ETOH

## 2018-03-16 ENCOUNTER — Emergency Department (HOSPITAL_COMMUNITY): Payer: Medicare Other

## 2018-03-16 ENCOUNTER — Encounter (HOSPITAL_COMMUNITY): Payer: Self-pay | Admitting: Nurse Practitioner

## 2018-03-16 ENCOUNTER — Inpatient Hospital Stay (HOSPITAL_COMMUNITY)
Admission: EM | Admit: 2018-03-16 | Discharge: 2018-03-20 | DRG: 885 | Disposition: A | Discharge status: Other Acute Inpatient Hospital | Payer: Medicare Other | Attending: Internal Medicine | Admitting: Internal Medicine

## 2018-03-16 DIAGNOSIS — F419 Anxiety disorder, unspecified: Secondary | ICD-10-CM | POA: Diagnosis not present

## 2018-03-16 DIAGNOSIS — Z881 Allergy status to other antibiotic agents status: Secondary | ICD-10-CM

## 2018-03-16 DIAGNOSIS — R4182 Altered mental status, unspecified: Secondary | ICD-10-CM | POA: Diagnosis not present

## 2018-03-16 DIAGNOSIS — W57XXXA Bitten or stung by nonvenomous insect and other nonvenomous arthropods, initial encounter: Secondary | ICD-10-CM | POA: Diagnosis present

## 2018-03-16 DIAGNOSIS — Z880 Allergy status to penicillin: Secondary | ICD-10-CM | POA: Diagnosis not present

## 2018-03-16 DIAGNOSIS — R7989 Other specified abnormal findings of blood chemistry: Secondary | ICD-10-CM | POA: Diagnosis not present

## 2018-03-16 DIAGNOSIS — F3131 Bipolar disorder, current episode depressed, mild: Secondary | ICD-10-CM | POA: Diagnosis not present

## 2018-03-16 DIAGNOSIS — R112 Nausea with vomiting, unspecified: Secondary | ICD-10-CM | POA: Diagnosis present

## 2018-03-16 DIAGNOSIS — F17210 Nicotine dependence, cigarettes, uncomplicated: Secondary | ICD-10-CM | POA: Diagnosis present

## 2018-03-16 DIAGNOSIS — I10 Essential (primary) hypertension: Secondary | ICD-10-CM | POA: Diagnosis present

## 2018-03-16 DIAGNOSIS — F900 Attention-deficit hyperactivity disorder, predominantly inattentive type: Secondary | ICD-10-CM | POA: Diagnosis present

## 2018-03-16 DIAGNOSIS — Z79899 Other long term (current) drug therapy: Secondary | ICD-10-CM | POA: Diagnosis not present

## 2018-03-16 DIAGNOSIS — R1110 Vomiting, unspecified: Secondary | ICD-10-CM

## 2018-03-16 DIAGNOSIS — Z888 Allergy status to other drugs, medicaments and biological substances status: Secondary | ICD-10-CM

## 2018-03-16 DIAGNOSIS — R197 Diarrhea, unspecified: Secondary | ICD-10-CM | POA: Diagnosis not present

## 2018-03-16 DIAGNOSIS — F312 Bipolar disorder, current episode manic severe with psychotic features: Secondary | ICD-10-CM | POA: Diagnosis not present

## 2018-03-16 DIAGNOSIS — Z885 Allergy status to narcotic agent status: Secondary | ICD-10-CM

## 2018-03-16 DIAGNOSIS — Z8249 Family history of ischemic heart disease and other diseases of the circulatory system: Secondary | ICD-10-CM

## 2018-03-16 DIAGNOSIS — R079 Chest pain, unspecified: Secondary | ICD-10-CM | POA: Diagnosis not present

## 2018-03-16 DIAGNOSIS — R945 Abnormal results of liver function studies: Secondary | ICD-10-CM | POA: Diagnosis present

## 2018-03-16 DIAGNOSIS — R000 Tachycardia, unspecified: Secondary | ICD-10-CM | POA: Diagnosis not present

## 2018-03-16 DIAGNOSIS — R45851 Suicidal ideations: Secondary | ICD-10-CM | POA: Diagnosis present

## 2018-03-16 DIAGNOSIS — F250 Schizoaffective disorder, bipolar type: Secondary | ICD-10-CM | POA: Diagnosis not present

## 2018-03-16 DIAGNOSIS — Z883 Allergy status to other anti-infective agents status: Secondary | ICD-10-CM | POA: Diagnosis not present

## 2018-03-16 DIAGNOSIS — E876 Hypokalemia: Secondary | ICD-10-CM | POA: Diagnosis present

## 2018-03-16 DIAGNOSIS — F310 Bipolar disorder, current episode hypomanic: Secondary | ICD-10-CM | POA: Diagnosis not present

## 2018-03-16 DIAGNOSIS — R05 Cough: Secondary | ICD-10-CM | POA: Diagnosis not present

## 2018-03-16 LAB — CBC WITH DIFFERENTIAL/PLATELET
Basophils Absolute: 0 10*3/uL (ref 0.0–0.1)
Basophils Relative: 0 %
EOS PCT: 0 %
Eosinophils Absolute: 0 10*3/uL (ref 0.0–0.7)
HCT: 43.7 % (ref 36.0–46.0)
Hemoglobin: 15.5 g/dL — ABNORMAL HIGH (ref 12.0–15.0)
LYMPHS PCT: 11 %
Lymphs Abs: 1.5 10*3/uL (ref 0.7–4.0)
MCH: 33.4 pg (ref 26.0–34.0)
MCHC: 35.5 g/dL (ref 30.0–36.0)
MCV: 94.2 fL (ref 78.0–100.0)
Monocytes Absolute: 0.4 10*3/uL (ref 0.1–1.0)
Monocytes Relative: 3 %
NEUTROS PCT: 86 %
Neutro Abs: 12.1 10*3/uL — ABNORMAL HIGH (ref 1.7–7.7)
Platelets: 145 10*3/uL — ABNORMAL LOW (ref 150–400)
RBC: 4.64 MIL/uL (ref 3.87–5.11)
RDW: 14.2 % (ref 11.5–15.5)
WBC: 14 10*3/uL — ABNORMAL HIGH (ref 4.0–10.5)

## 2018-03-16 LAB — COMPREHENSIVE METABOLIC PANEL
ALT: 1766 U/L — ABNORMAL HIGH (ref 0–44)
AST: 206 U/L — ABNORMAL HIGH (ref 15–41)
Albumin: 4.2 g/dL (ref 3.5–5.0)
Alkaline Phosphatase: 86 U/L (ref 38–126)
Anion gap: 12 (ref 5–15)
BUN: 15 mg/dL (ref 6–20)
CO2: 19 mmol/L — ABNORMAL LOW (ref 22–32)
CREATININE: 1.37 mg/dL — AB (ref 0.44–1.00)
Calcium: 9.5 mg/dL (ref 8.9–10.3)
Chloride: 110 mmol/L (ref 98–111)
GFR calc non Af Amer: 45 mL/min — ABNORMAL LOW (ref >60)
GFR, EST AFRICAN AMERICAN: 52 mL/min — AB (ref >60)
Glucose, Bld: 121 mg/dL — ABNORMAL HIGH (ref 70–99)
Potassium: 3 mmol/L — ABNORMAL LOW (ref 3.5–5.1)
SODIUM: 141 mmol/L (ref 135–145)
Total Bilirubin: 1.7 mg/dL — ABNORMAL HIGH (ref 0.3–1.2)
Total Protein: 7.3 g/dL (ref 6.5–8.1)

## 2018-03-16 LAB — LIPASE, BLOOD: LIPASE: 39 U/L (ref 11–51)

## 2018-03-16 LAB — RAPID URINE DRUG SCREEN, HOSP PERFORMED
Amphetamines: POSITIVE — AB
Barbiturates: NOT DETECTED
Benzodiazepines: NOT DETECTED
Cocaine: NOT DETECTED
Opiates: NOT DETECTED
Tetrahydrocannabinol: NOT DETECTED

## 2018-03-16 LAB — URINALYSIS, ROUTINE W REFLEX MICROSCOPIC
BACTERIA UA: NONE SEEN
Bilirubin Urine: NEGATIVE
Glucose, UA: NEGATIVE mg/dL
KETONES UR: 20 mg/dL — AB
LEUKOCYTES UA: NEGATIVE
NITRITE: NEGATIVE
PROTEIN: NEGATIVE mg/dL
Specific Gravity, Urine: 1.013 (ref 1.005–1.030)
pH: 6 (ref 5.0–8.0)

## 2018-03-16 LAB — I-STAT BETA HCG BLOOD, ED (MC, WL, AP ONLY)

## 2018-03-16 LAB — PROTIME-INR
INR: 1.1
Prothrombin Time: 14.2 seconds (ref 11.4–15.2)

## 2018-03-16 LAB — ACETAMINOPHEN LEVEL: Acetaminophen (Tylenol), Serum: <10 ug/mL — ABNORMAL LOW (ref 10–30)

## 2018-03-16 LAB — SALICYLATE LEVEL

## 2018-03-16 LAB — ETHANOL

## 2018-03-16 LAB — I-STAT CG4 LACTIC ACID, ED
LACTIC ACID, VENOUS: 1.92 mmol/L — AB (ref 0.5–1.9)
LACTIC ACID, VENOUS: 1.5 mmol/L (ref 0.5–1.9)

## 2018-03-16 MED ORDER — VANCOMYCIN HCL IN DEXTROSE 1-5 GM/200ML-% IV SOLN
1000.0000 mg | Freq: Once | INTRAVENOUS | Status: AC
  Administered 2018-03-17: 1000 mg via INTRAVENOUS
  Filled 2018-03-16: qty 200

## 2018-03-16 MED ORDER — DOXYCYCLINE HYCLATE 100 MG PO TABS
100.0000 mg | ORAL_TABLET | Freq: Once | ORAL | Status: DC
  Filled 2018-03-16 (×2): qty 1

## 2018-03-16 MED ORDER — AZTREONAM 2 G IJ SOLR
2.0000 g | Freq: Once | INTRAMUSCULAR | Status: AC
  Administered 2018-03-16: 2 g via INTRAVENOUS
  Filled 2018-03-16: qty 2

## 2018-03-16 MED ORDER — LEVOFLOXACIN IN D5W 750 MG/150ML IV SOLN
750.0000 mg | Freq: Once | INTRAVENOUS | Status: AC
  Administered 2018-03-16: 750 mg via INTRAVENOUS
  Filled 2018-03-16: qty 150

## 2018-03-16 MED ORDER — SODIUM CHLORIDE 0.9 % IV SOLN
100.0000 mg | Freq: Once | INTRAVENOUS | Status: AC
  Administered 2018-03-16: 100 mg via INTRAVENOUS
  Filled 2018-03-16: qty 100

## 2018-03-16 MED ORDER — ONDANSETRON 8 MG PO TBDP
8.0000 mg | ORAL_TABLET | Freq: Once | ORAL | Status: AC
  Administered 2018-03-16: 8 mg via ORAL
  Filled 2018-03-16: qty 1

## 2018-03-16 MED ORDER — SODIUM CHLORIDE 0.9 % IV BOLUS
1000.0000 mL | Freq: Once | INTRAVENOUS | Status: AC
  Administered 2018-03-16: 1000 mL via INTRAVENOUS

## 2018-03-16 MED ORDER — POTASSIUM CHLORIDE 10 MEQ/100ML IV SOLN
10.0000 meq | Freq: Once | INTRAVENOUS | Status: AC
  Administered 2018-03-16: 10 meq via INTRAVENOUS
  Filled 2018-03-16: qty 100

## 2018-03-16 NOTE — ED Notes (Signed)
Pt has one belongings' bag that was moved to the cabinet labeled "patient belongings [id number]" which is located next to the tube station.
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
## 2018-03-16 NOTE — ED Notes (Signed)
Bed: [id number] Expected date:  Expected time:  Means of arrival:  Comments: Room 26
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
## 2018-03-16 NOTE — ED Notes (Signed)
k + left ac  antibiotics right ac

## 2018-03-16 NOTE — ED Notes (Signed)
Patient transferred to main ED room 13

## 2018-03-16 NOTE — ED Notes (Addendum)
Patti CN updated w/ vs
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
## 2018-03-16 NOTE — ED Notes (Signed)
Dr Hyacinth MeekerMiller into see

## 2018-03-16 NOTE — Progress Notes (Signed)
A consult was received from an ED physician for Vancomycin, Levaquin, and  Aztreonam per pharmacy dosing.  The patient's profile has been reviewed for ht/wt/allergies/indication/available labs.   A one time order has been placed for Vancomycin 1gm, Aztreonam 2gm, and Levaquin 750mg  IV.  Further antibiotics/pharmacy consults should be ordered by admitting physician if indicated.                       Thank you, Maryellen PilePoindexter, Rashawd Laskaris Trefz, PharmD 03/16/2018  8:11 PM

## 2018-03-17 ENCOUNTER — Inpatient Hospital Stay (HOSPITAL_COMMUNITY): Payer: Medicare Other

## 2018-03-17 ENCOUNTER — Other Ambulatory Visit: Payer: Self-pay

## 2018-03-17 ENCOUNTER — Encounter (HOSPITAL_COMMUNITY): Payer: Self-pay | Admitting: Internal Medicine

## 2018-03-17 DIAGNOSIS — Z79899 Other long term (current) drug therapy: Secondary | ICD-10-CM | POA: Diagnosis not present

## 2018-03-17 DIAGNOSIS — E876 Hypokalemia: Secondary | ICD-10-CM | POA: Diagnosis not present

## 2018-03-17 DIAGNOSIS — F17210 Nicotine dependence, cigarettes, uncomplicated: Secondary | ICD-10-CM | POA: Diagnosis present

## 2018-03-17 DIAGNOSIS — R945 Abnormal results of liver function studies: Secondary | ICD-10-CM | POA: Diagnosis present

## 2018-03-17 DIAGNOSIS — I10 Essential (primary) hypertension: Secondary | ICD-10-CM | POA: Diagnosis present

## 2018-03-17 DIAGNOSIS — Z880 Allergy status to penicillin: Secondary | ICD-10-CM | POA: Diagnosis not present

## 2018-03-17 DIAGNOSIS — F310 Bipolar disorder, current episode hypomanic: Secondary | ICD-10-CM | POA: Diagnosis not present

## 2018-03-17 DIAGNOSIS — F900 Attention-deficit hyperactivity disorder, predominantly inattentive type: Secondary | ICD-10-CM | POA: Diagnosis not present

## 2018-03-17 DIAGNOSIS — F3131 Bipolar disorder, current episode depressed, mild: Secondary | ICD-10-CM | POA: Diagnosis not present

## 2018-03-17 DIAGNOSIS — Z8249 Family history of ischemic heart disease and other diseases of the circulatory system: Secondary | ICD-10-CM | POA: Diagnosis not present

## 2018-03-17 DIAGNOSIS — F312 Bipolar disorder, current episode manic severe with psychotic features: Secondary | ICD-10-CM | POA: Diagnosis not present

## 2018-03-17 DIAGNOSIS — W57XXXA Bitten or stung by nonvenomous insect and other nonvenomous arthropods, initial encounter: Secondary | ICD-10-CM | POA: Diagnosis present

## 2018-03-17 DIAGNOSIS — F419 Anxiety disorder, unspecified: Secondary | ICD-10-CM | POA: Diagnosis present

## 2018-03-17 DIAGNOSIS — Z881 Allergy status to other antibiotic agents status: Secondary | ICD-10-CM | POA: Diagnosis not present

## 2018-03-17 DIAGNOSIS — R112 Nausea with vomiting, unspecified: Secondary | ICD-10-CM | POA: Diagnosis not present

## 2018-03-17 DIAGNOSIS — Z883 Allergy status to other anti-infective agents status: Secondary | ICD-10-CM | POA: Diagnosis not present

## 2018-03-17 DIAGNOSIS — R197 Diarrhea, unspecified: Secondary | ICD-10-CM | POA: Diagnosis present

## 2018-03-17 DIAGNOSIS — R7989 Other specified abnormal findings of blood chemistry: Secondary | ICD-10-CM | POA: Diagnosis present

## 2018-03-17 DIAGNOSIS — R4182 Altered mental status, unspecified: Secondary | ICD-10-CM | POA: Diagnosis not present

## 2018-03-17 DIAGNOSIS — Z888 Allergy status to other drugs, medicaments and biological substances status: Secondary | ICD-10-CM | POA: Diagnosis not present

## 2018-03-17 DIAGNOSIS — R45851 Suicidal ideations: Secondary | ICD-10-CM | POA: Diagnosis present

## 2018-03-17 DIAGNOSIS — Z885 Allergy status to narcotic agent status: Secondary | ICD-10-CM | POA: Diagnosis not present

## 2018-03-17 LAB — CBC
HCT: 36.7 % (ref 36.0–46.0)
Hemoglobin: 12.7 g/dL (ref 12.0–15.0)
MCH: 33.1 pg (ref 26.0–34.0)
MCHC: 34.6 g/dL (ref 30.0–36.0)
MCV: 95.6 fL (ref 78.0–100.0)
PLATELETS: 122 10*3/uL — AB (ref 150–400)
RBC: 3.84 MIL/uL — AB (ref 3.87–5.11)
RDW: 14.6 % (ref 11.5–15.5)
WBC: 9.6 10*3/uL (ref 4.0–10.5)

## 2018-03-17 LAB — COMPREHENSIVE METABOLIC PANEL
ALK PHOS: 56 U/L (ref 38–126)
ALT: 1079 U/L — AB (ref 0–44)
AST: 97 U/L — AB (ref 15–41)
Albumin: 2.8 g/dL — ABNORMAL LOW (ref 3.5–5.0)
Anion gap: 5 (ref 5–15)
BUN: 7 mg/dL (ref 6–20)
CO2: 19 mmol/L — AB (ref 22–32)
CREATININE: 0.64 mg/dL (ref 0.44–1.00)
Calcium: 7.2 mg/dL — ABNORMAL LOW (ref 8.9–10.3)
Chloride: 115 mmol/L — ABNORMAL HIGH (ref 98–111)
GFR calc non Af Amer: >60 mL/min (ref >60)
GLUCOSE: 86 mg/dL (ref 70–99)
Potassium: 3.8 mmol/L (ref 3.5–5.1)
SODIUM: 139 mmol/L (ref 135–145)
Total Bilirubin: 1.3 mg/dL — ABNORMAL HIGH (ref 0.3–1.2)
Total Protein: 5.2 g/dL — ABNORMAL LOW (ref 6.5–8.1)

## 2018-03-17 LAB — CK TOTAL AND CKMB (NOT AT ARMC)
CK, MB: 3.3 ng/mL (ref 0.5–5.0)
Relative Index: 1.9 (ref 0.0–2.5)
Total CK: 176 U/L (ref 38–234)

## 2018-03-17 LAB — AMMONIA: AMMONIA: 21 umol/L (ref 9–35)

## 2018-03-17 LAB — HIV ANTIBODY (ROUTINE TESTING W REFLEX): HIV Screen 4th Generation wRfx: NONREACTIVE

## 2018-03-17 MED ORDER — ONDANSETRON HCL 4 MG/2ML IJ SOLN
4.0000 mg | Freq: Once | INTRAMUSCULAR | Status: AC
  Administered 2018-03-17: 4 mg via INTRAVENOUS
  Filled 2018-03-17: qty 2

## 2018-03-17 MED ORDER — GABAPENTIN 100 MG PO CAPS
200.0000 mg | ORAL_CAPSULE | Freq: Two times a day (BID) | ORAL | Status: DC
  Administered 2018-03-17 – 2018-03-20 (×7): 200 mg via ORAL
  Filled 2018-03-17 (×7): qty 2

## 2018-03-17 MED ORDER — SODIUM CHLORIDE 0.9 % IV SOLN
100.0000 mg | Freq: Two times a day (BID) | INTRAVENOUS | Status: DC
  Administered 2018-03-17 – 2018-03-18 (×3): 100 mg via INTRAVENOUS
  Filled 2018-03-17 (×4): qty 100

## 2018-03-17 MED ORDER — POTASSIUM CHLORIDE 10 MEQ/100ML IV SOLN
10.0000 meq | INTRAVENOUS | Status: AC
  Administered 2018-03-17 (×3): 10 meq via INTRAVENOUS
  Filled 2018-03-17 (×4): qty 100

## 2018-03-17 MED ORDER — ONDANSETRON HCL 4 MG/2ML IJ SOLN
4.0000 mg | Freq: Four times a day (QID) | INTRAMUSCULAR | Status: DC | PRN
  Administered 2018-03-17: 4 mg via INTRAVENOUS
  Filled 2018-03-17: qty 2

## 2018-03-17 MED ORDER — ALPRAZOLAM 0.25 MG PO TABS
0.2500 mg | ORAL_TABLET | Freq: Two times a day (BID) | ORAL | Status: DC | PRN
  Administered 2018-03-17 – 2018-03-19 (×5): 0.25 mg via ORAL
  Filled 2018-03-17 (×7): qty 1

## 2018-03-17 MED ORDER — POTASSIUM CHLORIDE IN NACL 20-0.9 MEQ/L-% IV SOLN
INTRAVENOUS | Status: DC
  Administered 2018-03-17 (×2): via INTRAVENOUS
  Filled 2018-03-17 (×2): qty 1000

## 2018-03-17 NOTE — ED Notes (Signed)
ED TO INPATIENT HANDOFF REPORT  Name/Age/Gender Carolyn Jennings 47 y.o. female  Code Status    Code Status Orders  (From admission, onward)        Start     Ordered   03/17/18 0045  Full code  Continuous     03/17/18 0047    Code Status History    Date Active Date Inactive Code Status Order ID Comments User Context   05/22/2017 0259 05/25/2017 1412 Full Code [id number]  Charlann Lange, PA-C ED   03/23/2015 0052 03/29/2015 1750 Full Code [id number]  Gypsy Lore, NP Inpatient   03/22/2015 1701 03/23/2015 0029 Full Code [id number]  Carmin Muskrat, MD ED   11/06/2014 1609 11/08/2014 1907 Full Code [id number]  Carman Ching, PA-C ED   12/07/2011 0732 12/07/2011 1440 Full Code [id number]  Georgiana Shore, RN Inpatient      Home/SNF/Other Home  Chief Complaint Psych Evaluation  Level of Care/Admitting Diagnosis ED Disposition    ED Disposition Condition [facility] Area: [facility] [[id number]]  Level of Care: Telemetry [5]  Admit to tele based on following criteria: Monitor for Ischemic changes  Diagnosis: Abnormal liver function [546568]  Admitting Physician: Jani Gravel [[id number]]  Attending Physician: Jani Gravel [phone]  Estimated length of stay: past midnight tomorrow  Certification:: I certify this patient will need inpatient services for at least 2 midnights  PT Class (Do Not Modify): Inpatient [101]  PT Acc Code (Do Not Modify): Private [1]       Medical History Past Medical History:  Diagnosis Date  . ADD (attention deficit disorder)   . Anxiety   . ASCUS with positive high risk HPV 2007  . Bipolar 1 disorder ([place])   . Depression   . Migraines     Allergies Allergies  Allergen Reactions  . Harlin Heys D-S] Swelling  . Doxycycline Nausea Only  . Metronidazole Nausea Only  . Penicillins Hives    Has patient had a PCN reaction causing immediate rash, facial/tongue/throat swelling, SOB or lightheadedness with hypotension:  Unknown Has patient had a PCN reaction causing severe rash involving mucus membranes or skin necrosis: Unknown Has patient had a PCN reaction that required hospitalization: Unknown Has patient had a PCN reaction occurring within the last 10 years: No If all of the above answers are "NO", then may proceed with Cephalosporin use.   . Progesterone Other (See Comments)    Tongue swelling  . Ampicillin Rash  . Diflucan [Fluconazole] Rash  . Hydrocodone Rash  . Oxycodone Rash    Pt cannot take any opioid    IV Location/Drains/Wounds Patient Lines/Drains/Airways Status   Active Line/Drains/Airways    Name:   Placement date:   Placement time:   Site:   Days:   Peripheral IV 03/16/18 Right Antecubital   03/16/18    1957    Antecubital   1   Peripheral IV 03/16/18 Left Antecubital   03/16/18    2205    Antecubital   1          Labs/Imaging Results for orders placed or performed during the hospital encounter of 03/16/18 (from the past 48 hour(s))  Comprehensive metabolic panel     Status: Abnormal   Collection Time: 03/16/18  7:53 PM  Result Value Ref Range   Sodium 141 135 - 145 mmol/L   Potassium 3.0 (L) 3.5 - 5.1 mmol/L   Chloride 110 98 - 111 mmol/L    Comment: Please note  change in reference range.   CO2 19 (L) 22 - 32 mmol/L   Glucose, Bld 121 (H) 70 - 99 mg/dL    Comment: Please note change in reference range.   BUN 15 6 - 20 mg/dL    Comment: Please note change in reference range.   Creatinine, Ser 1.37 (H) 0.44 - 1.00 mg/dL   Calcium 9.5 8.9 - 10.3 mg/dL   Total Protein 7.3 6.5 - 8.1 g/dL   Albumin 4.2 3.5 - 5.0 g/dL   AST 206 (H) 15 - 41 U/L   ALT 1,766 (H) 0 - 44 U/L    Comment: Please note change in reference range.   Alkaline Phosphatase 86 38 - 126 U/L   Total Bilirubin 1.7 (H) 0.3 - 1.2 mg/dL   GFR calc non Af Amer 45 (L) >60 mL/min   GFR calc Af Amer 52 (L) >60 mL/min    Comment: (NOTE) The eGFR has been calculated using the CKD EPI equation. This calculation  has not been validated in all clinical situations. eGFR's persistently <60 mL/min signify possible Chronic Kidney Disease.    Anion gap 12 5 - 15    Comment: Performed at [facility], [place] [address], [place], [place] [address]  Ethanol     Status: None   Collection Time: 03/16/18  7:53 PM  Result Value Ref Range   Alcohol, Ethyl (B) <10 <10 mg/dL    Comment: (NOTE) Lowest detectable limit for serum alcohol is 10 mg/dL. For medical purposes only. Performed at [facility], [place] [address], [place], [place] [address]   CBC with Diff     Status: Abnormal   Collection Time: 03/16/18  7:53 PM  Result Value Ref Range   WBC 14.0 (H) 4.0 - 10.5 K/uL   RBC 4.64 3.87 - 5.11 MIL/uL   Hemoglobin 15.5 (H) 12.0 - 15.0 g/dL   HCT 43.7 36.0 - 46.0 %   MCV 94.2 78.0 - 100.0 fL   MCH 33.4 26.0 - 34.0 pg   MCHC 35.5 30.0 - 36.0 g/dL   RDW 14.2 11.5 - 15.5 %   Platelets 145 (L) 150 - 400 K/uL   Neutrophils Relative % 86 %   Lymphocytes Relative 11 %   Monocytes Relative 3 %   Eosinophils Relative 0 %   Basophils Relative 0 %   Neutro Abs 12.1 (H) 1.7 - 7.7 K/uL   Lymphs Abs 1.5 0.7 - 4.0 K/uL   Monocytes Absolute 0.4 0.1 - 1.0 K/uL   Eosinophils Absolute 0.0 0.0 - 0.7 K/uL   Basophils Absolute 0.0 0.0 - 0.1 K/uL   Smear Review MORPHOLOGY UNREMARKABLE     Comment: Performed at [facility], [place] [address] [place], [place] [address]  Acetaminophen level     Status: Abnormal   Collection Time: 03/16/18  7:53 PM  Result Value Ref Range   Acetaminophen (Tylenol), Serum <10 (L) 10 - 30 ug/mL    Comment: (NOTE) Therapeutic concentrations vary significantly. A range of 10-30 ug/mL  may be an effective concentration for many patients. However, some  are best treated at concentrations outside of this range. Acetaminophen concentrations >150 ug/mL at 4 hours after ingestion  and >50 ug/mL at 12 hours after ingestion are often associated with   toxic reactions. Performed at [facility], [place] [address] [place], [place] [address]   Salicylate level     Status: None   Collection Time: 03/16/18  7:53 PM  Result Value Ref Range   Salicylate Lvl <4.2 2.8 - 30.0 mg/dL    Comment: Performed at [facility], [place] [address], [place], [place] [address]  Lipase, blood     Status: None   Collection Time: 03/16/18  7:53 PM  Result Value Ref Range   Lipase 39 11 - 51 U/L    Comment: Performed at [facility], [place] [address], [place], [place] [address]  I-Stat CG4 Lactic Acid, ED     Status: Abnormal   Collection Time: 03/16/18  8:04 PM  Result Value Ref Range   Lactic Acid, Venous 1.92 (H) 0.5 - 1.9 mmol/L  I-Stat beta hCG blood, ED     Status: None   Collection Time: 03/16/18  8:07 PM  Result Value Ref Range   I-stat hCG, quantitative <5.0 <5 mIU/mL   Comment 3            Comment:   GEST. AGE      CONC.  (mIU/mL)   <=1 WEEK        5 - 50     2 WEEKS       50 - 500     3 WEEKS       100 - 10,000     4 WEEKS     1,000 - 30,000        FEMALE AND NON-PREGNANT FEMALE:     LESS THAN 5 mIU/mL   Urine rapid drug screen (hosp performed)     Status: Abnormal   Collection Time: 03/16/18  9:39 PM  Result Value Ref Range   Opiates NONE DETECTED NONE DETECTED   Cocaine NONE DETECTED NONE DETECTED   Benzodiazepines NONE DETECTED NONE DETECTED   Amphetamines POSITIVE (A) NONE DETECTED   Tetrahydrocannabinol NONE DETECTED NONE DETECTED   Barbiturates NONE DETECTED NONE DETECTED    Comment: (NOTE) DRUG SCREEN FOR MEDICAL PURPOSES ONLY.  IF CONFIRMATION IS NEEDED FOR ANY PURPOSE, NOTIFY LAB WITHIN 5 DAYS. LOWEST DETECTABLE LIMITS FOR URINE DRUG SCREEN Drug Class                     Cutoff (ng/mL) Amphetamine and metabolites    1000 Barbiturate and metabolites    200 Benzodiazepine                 203 Tricyclics and metabolites     300 Opiates and metabolites        300 Cocaine  and metabolites        300 THC                            50 Performed at [facility], [place] [address] [place], [place] [address]   Urinalysis, Routine w reflex microscopic     Status: Abnormal   Collection Time: 03/16/18  9:39 PM  Result Value Ref Range   Color, Urine YELLOW YELLOW   APPearance HAZY (A) CLEAR   Specific Gravity, Urine 1.013 1.005 - 1.030   pH 6.0 5.0 - 8.0   Glucose, UA NEGATIVE NEGATIVE mg/dL   Hgb urine dipstick MODERATE (A) NEGATIVE   Bilirubin Urine NEGATIVE NEGATIVE   Ketones, ur 20 (A) NEGATIVE mg/dL   Protein, ur NEGATIVE NEGATIVE mg/dL   Nitrite NEGATIVE NEGATIVE   Leukocytes, UA NEGATIVE NEGATIVE   RBC / HPF 0-5 0 - 5 RBC/hpf   WBC, UA 0-5 0 - 5 WBC/hpf  Bacteria, UA NONE SEEN NONE SEEN   Squamous Epithelial / LPF 0-5 0 - 5   Mucus PRESENT    Hyaline Casts, UA PRESENT    Granular Casts, UA PRESENT     Comment: Performed at [facility], [place] [address], [place], [place] [address]  I-Stat CG4 Lactic Acid, ED     Status: None   Collection Time: 03/16/18  9:52 PM  Result Value Ref Range   Lactic Acid, Venous 1.50 0.5 - 1.9 mmol/L  Protime-INR     Status: None   Collection Time: 03/16/18  9:53 PM  Result Value Ref Range   Prothrombin Time 14.2 11.4 - 15.2 seconds   INR 1.10     Comment: Performed at [facility], [place] [address], [place], [place] [address]   Dg Chest 2 View  Result Date: 03/16/2018 CLINICAL DATA:  Cough and fever.  Chest pain EXAM: CHEST - 2 VIEW COMPARISON:  February 12, 2018 FINDINGS: Lungs are clear. Heart size and pulmonary vascularity are normal. No adenopathy. No pneumothorax. No bone lesions. IMPRESSION: No edema or consolidation. Electronically Signed   By: Lowella Grip III M.D.   On: 03/16/2018 20:42   US Abdomen Limited Ruq  Result Date: 03/16/2018 CLINICAL DATA:  Vomiting. EXAM: ULTRASOUND ABDOMEN LIMITED RIGHT UPPER QUADRANT COMPARISON:  None. FINDINGS:  Gallbladder: No gallstones or wall thickening visualized. No sonographic Murphy sign noted by sonographer. Common bile duct: Diameter: 3 mm, within normal limits. Liver: No focal lesion identified. Within normal limits in parenchymal echogenicity. Portal vein is patent on color Doppler imaging with normal direction of blood flow towards the liver. IMPRESSION: Normal study.  No hepatobiliary abnormality identified. Electronically Signed   By: Earle Gell M.D.   On: 03/16/2018 23:17    Pending Labs Unresulted Labs (From admission, onward)   Start     Ordered   03/17/18 0500  Comprehensive metabolic panel  Tomorrow morning,   R     03/17/18 0047   03/17/18 0500  CBC  Tomorrow morning,   R     03/17/18 0047   03/17/18 0057  B. burgdorfi antibodies  Add-on,   R     03/17/18 0056   03/17/18 0049  CK total and CKMB (cardiac)not at [facility]  Add-on,   R     03/17/18 0048   03/17/18 0044  HIV antibody (Routine Testing)  Once,   R     03/17/18 0047   03/17/18 0008  Ammonia  Add-on,   R     03/17/18 0007   03/16/18 2153  Hepatitis panel, acute  STAT,   STAT     03/16/18 2152   03/16/18 1954  Rocky mtn spotted fvr abs pnl(IgG+IgM)  Once,   R     03/16/18 1953   03/16/18 1953  Blood culture (routine x 2)  BLOOD CULTURE X 2,   STAT     03/16/18 1952      Vitals/Pain Today's Vitals   03/16/18 2140 03/16/18 2231 03/16/18 2331 03/17/18 0113  BP: (!) 170/92 (!) 177/96 (!) 164/80 (!) 148/93  Pulse: 99 96 (!) 54 (!) 103  Resp: _0 Temp: 98.9 F (37.2 C) 98.9 F (37.2 C)  98.7 F (37.1 C)  TempSrc: Rectal Rectal  Oral  SpO2: 100% 100% 98% 99%  PainSc: 0-No pain 0-No pain  8     Isolation Precautions No active isolations  Medications Medications  vancomycin (VANCOCIN) IVPB 1000 mg/200  mL premix (1,000 mg Intravenous New Bag/Given 03/17/18 0126)  potassium chloride 10 mEq in 100 mL IVPB (10 mEq Intravenous New Bag/Given 03/17/18 0050)  0.9 % NaCl with KCl 20 mEq/ L  infusion (has no  administration in time range)  ondansetron (ZOFRAN) injection 4 mg (has no administration in time range)  doxycycline (VIBRAMYCIN) 100 mg in sodium chloride 0.9 % 250 mL IVPB (has no administration in time range)  sodium chloride 0.9 % bolus 1,000 mL (0 mLs Intravenous Stopped 03/16/18 2206)  ondansetron (ZOFRAN-ODT) disintegrating tablet 8 mg (8 mg Oral Given 03/16/18 2007)  sodium chloride 0.9 % bolus 1,000 mL (0 mLs Intravenous Stopped 03/16/18 2126)  doxycycline (VIBRAMYCIN) 100 mg in sodium chloride 0.9 % 250 mL IVPB (0 mg Intravenous Stopped 03/16/18 2350)  levofloxacin (LEVAQUIN) IVPB 750 mg (0 mg Intravenous Stopped 03/17/18 0132)  aztreonam (AZACTAM) 2 g in sodium chloride 0.9 % 100 mL IVPB (0 g Intravenous Stopped 03/16/18 2126)  potassium chloride 10 mEq in 100 mL IVPB (0 mEq Intravenous Stopped 03/16/18 2349)  ondansetron (ZOFRAN) injection 4 mg (4 mg Intravenous Given 03/17/18 0008)    Mobility walks
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
## 2018-03-18 ENCOUNTER — Telehealth: Payer: Self-pay

## 2018-03-18 LAB — CBC
HCT: 37.3 % (ref 36.0–46.0)
Hemoglobin: 12.9 g/dL (ref 12.0–15.0)
MCH: 33.1 pg (ref 26.0–34.0)
MCHC: 34.6 g/dL (ref 30.0–36.0)
MCV: 95.6 fL (ref 78.0–100.0)
PLATELETS: 138 10*3/uL — AB (ref 150–400)
RBC: 3.9 MIL/uL (ref 3.87–5.11)
RDW: 15.1 % (ref 11.5–15.5)
WBC: 7.4 10*3/uL (ref 4.0–10.5)

## 2018-03-18 LAB — BASIC METABOLIC PANEL
Anion gap: 5 (ref 5–15)
BUN: 8 mg/dL (ref 6–20)
CALCIUM: 8.3 mg/dL — AB (ref 8.9–10.3)
CO2: 22 mmol/L (ref 22–32)
Chloride: 114 mmol/L — ABNORMAL HIGH (ref 98–111)
Creatinine, Ser: 0.68 mg/dL (ref 0.44–1.00)
GFR calc Af Amer: >60 mL/min (ref >60)
Glucose, Bld: 108 mg/dL — ABNORMAL HIGH (ref 70–99)
POTASSIUM: 4 mmol/L (ref 3.5–5.1)
SODIUM: 141 mmol/L (ref 135–145)

## 2018-03-18 LAB — HEPATITIS PANEL, ACUTE
HEP B C IGM: NEGATIVE
HEP B S AG: NEGATIVE
Hep A IgM: NEGATIVE

## 2018-03-18 LAB — T4, FREE: FREE T4: 1.25 ng/dL (ref 0.82–1.77)

## 2018-03-18 LAB — TSH: TSH: 1.618 u[IU]/mL (ref 0.350–4.500)

## 2018-03-18 LAB — HEPATIC FUNCTION PANEL
ALT: 745 U/L — AB (ref 0–44)
AST: 53 U/L — ABNORMAL HIGH (ref 15–41)
Albumin: 3.1 g/dL — ABNORMAL LOW (ref 3.5–5.0)
Alkaline Phosphatase: 54 U/L (ref 38–126)
BILIRUBIN INDIRECT: 0.7 mg/dL (ref 0.3–0.9)
Bilirubin, Direct: 0.2 mg/dL (ref 0.0–0.2)
TOTAL PROTEIN: 5.7 g/dL — AB (ref 6.5–8.1)
Total Bilirubin: 0.9 mg/dL (ref 0.3–1.2)

## 2018-03-18 LAB — B. BURGDORFI ANTIBODIES: B burgdorferi Ab IgG+IgM: <0.91 {ISR} (ref 0.00–0.90)

## 2018-03-18 MED ORDER — DOXYCYCLINE HYCLATE 100 MG PO TABS
100.0000 mg | ORAL_TABLET | Freq: Two times a day (BID) | ORAL | Status: DC
  Administered 2018-03-18 (×2): 100 mg via ORAL
  Filled 2018-03-18 (×2): qty 1

## 2018-03-18 MED ORDER — NICOTINE 21 MG/24HR TD PT24
21.0000 mg | MEDICATED_PATCH | Freq: Every day | TRANSDERMAL | Status: DC
  Filled 2018-03-18: qty 1
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
## 2018-03-18 NOTE — Progress Notes (Signed)
LCSW faxed patient out to inpatient psych facilities.

## 2018-03-18 NOTE — Telephone Encounter (Signed)
-----   Message from Unk LightningJennifer Lynne Lemmon, [place] sent at 03/18/2018 11:29 AM EDT ----- Regarding: Needs new pt appt for elevated LFT's Patient needs OV in 2-3 weeks with whoever is available. He has not been seen by us in hospital but we were asked to get him outpatient visit.   Thanks-JLL

## 2018-03-18 NOTE — Telephone Encounter (Signed)
The pt has been scheduled to see Carolyn Jennings on 04/04/18 at 2 pm.  She will be notified at discharge
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
## 2018-03-19 LAB — HEPATITIS PANEL, ACUTE
HCV Ab: <0.1 s/co ratio (ref 0.0–0.9)
HEP A IGM: NEGATIVE
HEP B C IGM: NEGATIVE
HEP B S AG: NEGATIVE

## 2018-03-19 LAB — COMPREHENSIVE METABOLIC PANEL
ALT: 486 U/L — ABNORMAL HIGH (ref 0–44)
AST: 37 U/L (ref 15–41)
Albumin: 3.2 g/dL — ABNORMAL LOW (ref 3.5–5.0)
Alkaline Phosphatase: 56 U/L (ref 38–126)
Anion gap: 7 (ref 5–15)
BUN: 9 mg/dL (ref 6–20)
CHLORIDE: 112 mmol/L — AB (ref 98–111)
CO2: 23 mmol/L (ref 22–32)
Calcium: 8.7 mg/dL — ABNORMAL LOW (ref 8.9–10.3)
Creatinine, Ser: 0.62 mg/dL (ref 0.44–1.00)
GFR calc non Af Amer: >60 mL/min (ref >60)
Glucose, Bld: 114 mg/dL — ABNORMAL HIGH (ref 70–99)
POTASSIUM: 3.8 mmol/L (ref 3.5–5.1)
Sodium: 142 mmol/L (ref 135–145)
Total Bilirubin: 0.8 mg/dL (ref 0.3–1.2)
Total Protein: 5.9 g/dL — ABNORMAL LOW (ref 6.5–8.1)

## 2018-03-19 LAB — ROCKY MTN SPOTTED FVR ABS PNL(IGG+IGM)
RMSF IGG: NEGATIVE
RMSF IGM: 0.86 {index} (ref 0.00–0.89)

## 2018-03-19 MED ORDER — ENOXAPARIN SODIUM 30 MG/0.3ML ~~LOC~~ SOLN
30.0000 mg | SUBCUTANEOUS | Status: DC
  Administered 2018-03-19 – 2018-03-20 (×2): 30 mg via SUBCUTANEOUS
  Filled 2018-03-19 (×2): qty 0.3
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
## 2018-03-19 NOTE — Progress Notes (Addendum)
CSW received a call from [name] Pt has been accepted by: [facility] Number for report is: [phone] Pt's unit/room/bed number will be: Main Campus Accepting physician: Dr. Estill Cottahomas Cornwall   Pt can arrive ASAP after 10 am on 03/19/18  CSW will update RN.  2nd shift ED CSW will leave handoff for 1st shift ED CSW.  4:58 PM  CSW updated RN who voiced understanding.  Dorothe PeaJonathan F. Starlet Gallentine, LCSW, LCAS, CSI Clinical Social Worker Ph: [phone]   '

## 2018-03-19 NOTE — Progress Notes (Signed)
LCSW patient under review at [facility].

## 2018-03-19 NOTE — Progress Notes (Signed)
No bed today. LCSW faxed patient out to psych facilities again. No beds at [facility] or [facility].   Beulah GandyBernette Myrl Bynum, LSCW [facility]W [phone]

## 2018-03-20 DIAGNOSIS — R945 Abnormal results of liver function studies: Secondary | ICD-10-CM

## 2018-03-20 DIAGNOSIS — R45851 Suicidal ideations: Secondary | ICD-10-CM | POA: Diagnosis not present

## 2018-03-20 DIAGNOSIS — F3131 Bipolar disorder, current episode depressed, mild: Secondary | ICD-10-CM

## 2018-03-20 DIAGNOSIS — I10 Essential (primary) hypertension: Secondary | ICD-10-CM | POA: Diagnosis not present

## 2018-03-20 DIAGNOSIS — F310 Bipolar disorder, current episode hypomanic: Secondary | ICD-10-CM

## 2018-03-20 DIAGNOSIS — F17210 Nicotine dependence, cigarettes, uncomplicated: Secondary | ICD-10-CM | POA: Diagnosis not present

## 2018-03-20 DIAGNOSIS — F312 Bipolar disorder, current episode manic severe with psychotic features: Principal | ICD-10-CM

## 2018-03-20 DIAGNOSIS — F250 Schizoaffective disorder, bipolar type: Secondary | ICD-10-CM | POA: Diagnosis not present

## 2018-03-20 DIAGNOSIS — R112 Nausea with vomiting, unspecified: Secondary | ICD-10-CM

## 2018-03-20 LAB — HEPATIC FUNCTION PANEL
ALT: 305 U/L — ABNORMAL HIGH (ref 0–44)
AST: 30 U/L (ref 15–41)
Albumin: 3.3 g/dL — ABNORMAL LOW (ref 3.5–5.0)
Alkaline Phosphatase: 49 U/L (ref 38–126)
BILIRUBIN DIRECT: 0.2 mg/dL (ref 0.0–0.2)
BILIRUBIN TOTAL: 0.8 mg/dL (ref 0.3–1.2)
Indirect Bilirubin: 0.6 mg/dL (ref 0.3–0.9)
Total Protein: 6 g/dL — ABNORMAL LOW (ref 6.5–8.1)

## 2018-03-20 MED ORDER — NICOTINE 21 MG/24HR TD PT24: 21.0000 mg | MEDICATED_PATCH | Freq: Every day | TRANSDERMAL | 0 refills | Status: AC

## 2018-03-20 MED ORDER — GABAPENTIN 100 MG PO CAPS: 200.0000 mg | ORAL_CAPSULE | Freq: Two times a day (BID) | ORAL | 0 refills | Status: AC

## 2018-03-20 NOTE — Progress Notes (Signed)
Called report to [facility]. Gave report to Dollar GeneralKelly RN.
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
## 2018-03-20 NOTE — Progress Notes (Signed)
Patient has a bed at [facility].   Patient is Voluntary and transport has been set up with [facility].   Coralyn HellingBernette Emrys Mck[place]ie, Norberta KeensLSCW [place] CSW [phone]

## 2018-03-20 NOTE — Progress Notes (Signed)
CSW received call from [place], Chief Financial OfficerMarketing Rep for [facility] requesting information on pt's departure time.  CSW contacted [facility] CSW Noreene FilbertBernette J, [place], who agreed to have pt's nurse call [facility] with report and advise of departure time.  Timmothy EulerJean T. Kaylyn LimSutter, MSW, LCSWA Disposition Clinical Social Work [phone] (cell) [phone] (office)
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
## 2018-03-22 LAB — CULTURE, BLOOD (ROUTINE X 2)
Culture: NO GROWTH
Culture: NO GROWTH

## 2018-04-04 ENCOUNTER — Ambulatory Visit: Payer: Self-pay | Admitting: Physician Assistant

## 2018-04-09 DIAGNOSIS — F3181 Bipolar II disorder: Secondary | ICD-10-CM | POA: Diagnosis not present

## 2018-04-09 DIAGNOSIS — F909 Attention-deficit hyperactivity disorder, unspecified type: Secondary | ICD-10-CM | POA: Diagnosis not present

## 2018-04-26 ENCOUNTER — Emergency Department
Admission: EM | Admit: 2018-04-26 | Discharge: 2018-04-26 | Discharge status: Against Medical Advice | Payer: Medicare Other

## 2018-04-26 NOTE — ED Notes (Signed)
First Nurse Note: Patient arrives stating she is going to pass out and that her BP is low.  VS upon arrival, Pulse 0x-99%, HR-73, RR-16, BP-102/72 right arm with automatic cuff.  Alert and oriented, color good, skin warm and dry.

## 2018-05-07 DIAGNOSIS — F3181 Bipolar II disorder: Secondary | ICD-10-CM | POA: Diagnosis not present

## 2018-05-07 DIAGNOSIS — F909 Attention-deficit hyperactivity disorder, unspecified type: Secondary | ICD-10-CM | POA: Diagnosis not present

## 2018-06-04 DIAGNOSIS — F909 Attention-deficit hyperactivity disorder, unspecified type: Secondary | ICD-10-CM | POA: Diagnosis not present

## 2018-06-04 DIAGNOSIS — F3181 Bipolar II disorder: Secondary | ICD-10-CM | POA: Diagnosis not present

## 2018-07-02 DIAGNOSIS — F3181 Bipolar II disorder: Secondary | ICD-10-CM | POA: Diagnosis not present

## 2018-07-29 DIAGNOSIS — F909 Attention-deficit hyperactivity disorder, unspecified type: Secondary | ICD-10-CM | POA: Diagnosis not present

## 2018-07-29 DIAGNOSIS — F3181 Bipolar II disorder: Secondary | ICD-10-CM | POA: Diagnosis not present

## 2018-09-23 DIAGNOSIS — F3181 Bipolar II disorder: Secondary | ICD-10-CM | POA: Diagnosis not present

## 2018-10-14 DIAGNOSIS — H5213 Myopia, bilateral: Secondary | ICD-10-CM | POA: Diagnosis not present

## 2018-10-21 DIAGNOSIS — F3181 Bipolar II disorder: Secondary | ICD-10-CM | POA: Diagnosis not present

## 2018-11-18 DIAGNOSIS — F909 Attention-deficit hyperactivity disorder, unspecified type: Secondary | ICD-10-CM | POA: Diagnosis not present

## 2019-01-13 DIAGNOSIS — F909 Attention-deficit hyperactivity disorder, unspecified type: Secondary | ICD-10-CM | POA: Diagnosis not present

## 2019-01-13 DIAGNOSIS — F3181 Bipolar II disorder: Secondary | ICD-10-CM | POA: Diagnosis not present

## 2019-02-11 ENCOUNTER — Other Ambulatory Visit (HOSPITAL_COMMUNITY): Payer: Self-pay | Admitting: Psychiatry

## 2019-03-10 DIAGNOSIS — F3181 Bipolar II disorder: Secondary | ICD-10-CM | POA: Diagnosis not present

## 2019-03-10 DIAGNOSIS — F909 Attention-deficit hyperactivity disorder, unspecified type: Secondary | ICD-10-CM | POA: Diagnosis not present

## 2019-05-06 DIAGNOSIS — F3181 Bipolar II disorder: Secondary | ICD-10-CM | POA: Diagnosis not present

## 2019-06-02 ENCOUNTER — Other Ambulatory Visit (HOSPITAL_COMMUNITY)
Admission: RE | Admit: 2019-06-02 | Discharge: 2019-06-02 | Disposition: A | Discharge status: Home / Self Care | Payer: Medicare Other | Source: Ambulatory Visit | Attending: Nurse Practitioner | Admitting: Nurse Practitioner

## 2019-06-02 DIAGNOSIS — N898 Other specified noninflammatory disorders of vagina: Secondary | ICD-10-CM | POA: Diagnosis not present

## 2019-06-02 DIAGNOSIS — Z124 Encounter for screening for malignant neoplasm of cervix: Secondary | ICD-10-CM | POA: Diagnosis present

## 2019-06-02 DIAGNOSIS — R030 Elevated blood-pressure reading, without diagnosis of hypertension: Secondary | ICD-10-CM | POA: Diagnosis not present

## 2019-06-02 DIAGNOSIS — R87810 Cervical high risk human papillomavirus (HPV) DNA test positive: Secondary | ICD-10-CM | POA: Diagnosis not present

## 2019-06-02 DIAGNOSIS — Z1151 Encounter for screening for human papillomavirus (HPV): Secondary | ICD-10-CM | POA: Insufficient documentation

## 2019-06-02 DIAGNOSIS — Z01419 Encounter for gynecological examination (general) (routine) without abnormal findings: Secondary | ICD-10-CM | POA: Diagnosis not present

## 2019-06-02 DIAGNOSIS — R300 Dysuria: Secondary | ICD-10-CM | POA: Diagnosis not present

## 2019-06-23 DIAGNOSIS — R000 Tachycardia, unspecified: Secondary | ICD-10-CM | POA: Diagnosis not present

## 2019-06-23 DIAGNOSIS — E0590 Thyrotoxicosis, unspecified without thyrotoxic crisis or storm: Secondary | ICD-10-CM | POA: Diagnosis not present

## 2019-06-23 DIAGNOSIS — I10 Essential (primary) hypertension: Secondary | ICD-10-CM | POA: Diagnosis not present

## 2019-06-23 DIAGNOSIS — Z658 Other specified problems related to psychosocial circumstances: Secondary | ICD-10-CM | POA: Diagnosis not present

## 2019-06-30 DIAGNOSIS — F909 Attention-deficit hyperactivity disorder, unspecified type: Secondary | ICD-10-CM | POA: Diagnosis not present

## 2019-07-21 DIAGNOSIS — E0590 Thyrotoxicosis, unspecified without thyrotoxic crisis or storm: Secondary | ICD-10-CM | POA: Diagnosis not present

## 2019-07-21 DIAGNOSIS — R000 Tachycardia, unspecified: Secondary | ICD-10-CM | POA: Diagnosis not present

## 2019-07-23 ENCOUNTER — Other Ambulatory Visit (HOSPITAL_COMMUNITY): Payer: Self-pay | Admitting: Family Medicine

## 2019-07-23 DIAGNOSIS — E0590 Thyrotoxicosis, unspecified without thyrotoxic crisis or storm: Secondary | ICD-10-CM

## 2019-08-06 ENCOUNTER — Other Ambulatory Visit: Payer: Self-pay

## 2019-08-06 ENCOUNTER — Encounter (HOSPITAL_BASED_OUTPATIENT_CLINIC_OR_DEPARTMENT_OTHER)
Admission: RE | Admit: 2019-08-06 | Discharge: 2019-08-06 | Disposition: A | Discharge status: Home / Self Care | Payer: Medicare Other | Source: Ambulatory Visit | Attending: Family Medicine | Admitting: Family Medicine

## 2019-08-06 ENCOUNTER — Ambulatory Visit (HOSPITAL_COMMUNITY)
Admission: RE | Admit: 2019-08-06 | Discharge: 2019-08-06 | Disposition: A | Discharge status: Home / Self Care | Payer: Medicare Other | Source: Ambulatory Visit | Attending: Family Medicine | Admitting: Family Medicine

## 2019-08-06 DIAGNOSIS — E0590 Thyrotoxicosis, unspecified without thyrotoxic crisis or storm: Secondary | ICD-10-CM

## 2019-08-06 MED ORDER — SODIUM IODIDE I-123 7.4 MBQ CAPS
433.0000 | ORAL_CAPSULE | Freq: Once | ORAL | Status: AC
  Administered 2019-08-06: 13:00:00 433 via ORAL

## 2019-08-07 ENCOUNTER — Ambulatory Visit (HOSPITAL_COMMUNITY)
Admission: RE | Admit: 2019-08-07 | Discharge: 2019-08-07 | Disposition: A | Discharge status: Home / Self Care | Payer: Medicare Other | Source: Ambulatory Visit | Attending: Family Medicine | Admitting: Family Medicine
